# Patient Record
Sex: Male | Born: 1975
Health system: Southern US, Community
[De-identification: ages and names within clinical notes are randomized; demographics above are authoritative.]

## PROBLEM LIST (undated history)

## (undated) DIAGNOSIS — L409 Psoriasis, unspecified: Secondary | ICD-10-CM

---

## 2010-06-12 ENCOUNTER — Ambulatory Visit: Payer: Self-pay | Admitting: Family Medicine

## 2010-06-12 DIAGNOSIS — M2550 Pain in unspecified joint: Secondary | ICD-10-CM | POA: Insufficient documentation

## 2010-06-12 DIAGNOSIS — L408 Other psoriasis: Secondary | ICD-10-CM | POA: Insufficient documentation

## 2010-06-12 LAB — CONVERTED CEMR LAB: Rheumatoid fact SerPl-aCnc: <20 [IU]/mL (ref 0–20)

## 2010-06-15 LAB — CONVERTED CEMR LAB
ALT: 28 units/L (ref 0–53)
AST: 23 U/L (ref 0–37)
Albumin: 4.2 g/dL (ref 3.5–5.2)
Alkaline Phosphatase: 75 U/L (ref 39–117)
BUN: 14 mg/dL (ref 6–23)
Basophils Absolute: 0 10*3/uL (ref 0.0–0.1)
Basophils Relative: 0.5 % (ref 0.0–3.0)
Bilirubin, Direct: 0 mg/dL (ref 0.0–0.3)
CO2: 30 meq/L (ref 19–32)
Calcium: 9.8 mg/dL (ref 8.4–10.5)
Chloride: 103 meq/L (ref 96–112)
Cholesterol: 192 mg/dL (ref 0–200)
Creatinine, Ser: 0.9 mg/dL (ref 0.4–1.5)
Eosinophils Absolute: 0.1 10*3/uL (ref 0.0–0.7)
Eosinophils Relative: 1.2 % (ref 0.0–5.0)
GFR calc non Af Amer: 100.36 mL/min (ref >60)
Glucose, Bld: 96 mg/dL (ref 70–99)
HCT: 40.8 % (ref 39.0–52.0)
HDL: 46.3 mg/dL (ref >39.00)
Hemoglobin: 14.2 g/dL (ref 13.0–17.0)
LDL Cholesterol: 133 mg/dL — ABNORMAL HIGH (ref 0–99)
Lymphocytes Relative: 26 % (ref 12.0–46.0)
Lymphs Abs: 1.4 10*3/uL (ref 0.7–4.0)
MCHC: 34.7 g/dL (ref 30.0–36.0)
MCV: 90.7 fL (ref 78.0–100.0)
Monocytes Absolute: 0.4 10*3/uL (ref 0.1–1.0)
Monocytes Relative: 7 % (ref 3.0–12.0)
Neutro Abs: 3.4 10*3/uL (ref 1.4–7.7)
Neutrophils Relative %: 65.3 % (ref 43.0–77.0)
Platelets: 222 10*3/uL (ref 150.0–400.0)
Potassium: 5.1 meq/L (ref 3.5–5.1)
RBC: 4.5 M/uL (ref 4.22–5.81)
RDW: 13 % (ref 11.5–14.6)
Sed Rate: 21 mm/h (ref 0–22)
Sodium: 142 meq/L (ref 135–145)
TSH: 1.12 microintl units/mL (ref 0.35–5.50)
Total Bilirubin: 0.5 mg/dL (ref 0.3–1.2)
Total CHOL/HDL Ratio: 4
Total Protein: 7 g/dL (ref 6.0–8.3)
Triglycerides: 62 mg/dL (ref 0.0–149.0)
Uric Acid, Serum: 7.7 mg/dL (ref 4.0–7.8)
VLDL: 12.4 mg/dL (ref 0.0–40.0)
WBC: 5.2 10*3/uL (ref 4.5–10.5)

## 2010-09-08 NOTE — Assessment & Plan Note (Signed)
Summary: BRAND NEW PT/TO EST/PT REQ CPX/PT COMING IN FASTING/CJR   Vital Signs:  Patient profile:   35 year old male Height:      68.75 inches Weight:      183 pounds BMI:     27.32 Temp:     98.6 degrees F oral Pulse rate:   80 / minute Pulse rhythm:   regular Resp:     12 per minute BP sitting:   120 / 80  (left arm) Cuff size:   regular  Vitals Entered By: Sid Falcon LPN (June 12, 2010 8:59 AM)  Nutrition Counseling: Patient's BMI is greater than 25 and therefore counseled on weight management options.   History of Present Illness: new patient to establish care and for complete physical examination.  Past medical history reviewed. He has history of reported psoriasis and possible seborrheic dermatitis. Uses various topical steroids per dermatologist. History of several arthralgias mostly involving ankle, toes, and hands. Takes anti-inflammatories as needed.  No dx of psoriatic arthritis. Past labs unremarkable.  He has questioned gout but does not recall any prior uric acid and no arthrocentesis for dx.   Reported heart murmur with prior echo showing no evidence for mitral valve prolapse. No known drug allergies.  Family history significant for 2 siblings with reported hereditary spherocytosis. Father with bipolar illness. Hypertension in grandparents.  Patient is married. Massage therapist. Nonsmoker. No alcohol use.  Preventive Screening-Counseling & Management  Alcohol-Tobacco     Smoking Status: never  Caffeine-Diet-Exercise     Does Patient Exercise: yes  Allergies (verified): No Known Drug Allergies  Past History:  Family History: Last updated: 06/12/2010 Father, hypertension, bi-polar  Social History: Last updated: 06/12/2010 Occupation:  Massage therapist Married Never Smoked Alcohol use-no Regular exercise-yes  Risk Factors: Exercise: yes (06/12/2010)  Risk Factors: Smoking Status: never (06/12/2010)  Past Medical History: chicken  pox heart murmur Psoraisis Seborrheic dermatitis PMH-FH-SH reviewed for relevance  Family History: Father, hypertension, bi-polar  Social History: Occupation:  Massage therapist Married Never Smoked Alcohol use-no Regular exercise-yes Smoking Status:  never Occupation:  employed Does Patient Exercise:  yes  Review of Systems       The patient complains of weight gain.  The patient denies anorexia, fever, weight loss, vision loss, decreased hearing, hoarseness, chest pain, syncope, dyspnea on exertion, peripheral edema, prolonged cough, headaches, hemoptysis, abdominal pain, melena, hematochezia, severe indigestion/heartburn, hematuria, incontinence, genital sores, muscle weakness, suspicious skin lesions, transient blindness, difficulty walking, depression, unusual weight change, abnormal bleeding, enlarged lymph nodes, and testicular masses.    Physical Exam  General:  Well-developed,well-nourished,in no acute distress; alert,appropriate and cooperative throughout examination Head:  Normocephalic and atraumatic without obvious abnormalities. No apparent alopecia or balding. Eyes:  No corneal or conjunctival inflammation noted. EOMI. Perrla. Funduscopic exam benign, without hemorrhages, exudates or papilledema. Vision grossly normal. Ears:  External ear exam shows no significant lesions or deformities.  Otoscopic examination reveals clear canals, tympanic membranes are intact bilaterally without bulging, retraction, inflammation or discharge. Hearing is grossly normal bilaterally. Mouth:  Oral mucosa and oropharynx without lesions or exudates.  Teeth in good repair. Neck:  No deformities, masses, or tenderness noted. Lungs:  Normal respiratory effort, chest expands symmetrically. Lungs are clear to auscultation, no crackles or wheezes. Heart:  Normal rate and regular rhythm. S1 and S2 normal without gallop, murmur, click, rub or other extra sounds. Abdomen:  Bowel sounds  positive,abdomen soft and non-tender without masses, organomegaly or hernias noted. Genitalia:  Testes bilaterally descended  without nodularity, tenderness or masses. No scrotal masses or lesions. No penis lesions or urethral discharge. Msk:  No deformity or scoliosis noted of thoracic or lumbar spine.   Extremities:  R ankle edema medially with mild warmth but no erythema. Neurologic:  No cranial nerve deficits noted. Station and gait are normal. Plantar reflexes are down-going bilaterally. DTRs are symmetrical throughout. Sensory, motor and coordinative functions appear intact. Skin:  no suspicious lesions.  patient has areas of rash mostly behind both ears reddish well demarcated wtih silvery scale. Similar rash both knees. Cervical Nodes:  No lymphadenopathy noted Psych:  Cognition and judgment appear intact. Alert and cooperative with normal attention span and concentration. No apparent delusions, illusions, hallucinations   Impression & Recommendations:  Problem # 1:  Preventive Health Care (ICD-V70.0) discussed exercise, weight control  Problem # 2:  ARTHRALGIA (ICD-719.40) doubt acute inflammatroy arthritis.  No active signs of inflammation on exam at this time. Orders: T-Rheumatoid Factor [phone]) Venipuncture (96295) Specimen Handling (28413) TLB-Uric Acid, Blood (84550-URIC) TLB-Sedimentation Rate (ESR) (85652-ESR)  Problem # 3:  PSORIASIS (ICD-696.1)  Complete Medication List: 1)  Celebrex 200 Mg Caps (Celecoxib) .... One by mouth once daily as needed 2)  Diclofenac Sodium 75 Mg Tbec (Diclofenac sodium) .... As needed 3)  Methocarbamol 500 Mg Tabs (Methocarbamol) .... As needed 4)  Fluocinonide 0.05 % Crea (Fluocinonide) .... As needed 5)  Desonide 0.05 % Crea (Desonide) .... As needed  Other Orders: TLB-Lipid Panel (80061-LIPID) TLB-BMP (Basic Metabolic Panel-BMET) (80048-METABOL) TLB-CBC Platelet - w/Differential (85025-CBCD) TLB-Hepatic/Liver Function Pnl  (80076-HEPATIC) TLB-TSH (Thyroid Stimulating Hormone) (24401-UUV)  Patient Instructions: 1)  Please schedule a follow-up appointment in 1 year.  2)  It is important that you exercise reguarly at least 20 minutes 5 times a week. If you develop chest pain, have severe difficulty breathing, or feel very tired, stop exercising immediately and seek medical attention.  Prescriptions: CELEBREX 200 MG CAPS (CELECOXIB) one by mouth once daily as needed  #30 x 5   Entered and Authorized by:   Evelena Peat MD   Signed by:   Evelena Peat MD on 06/12/2010   Method used:   Electronically to        [facility]* (retail)       [address]       [address] Shipping/mailing       [place], [place]  [address]       Ph: [phone]       Fax: [phone]   RxID:   [id number]    Orders Added: 1)  New Patient 18-39 years [99385] 2)  T-Rheumatoid Factor [id number] 3)  Venipuncture [32202] 4)  Specimen Handling [99000] 5)  TLB-Lipid Panel [80061-LIPID] 6)  TLB-BMP (Basic Metabolic Panel-BMET) [80048-METABOL] 7)  TLB-CBC Platelet - w/Differential [85025-CBCD] 8)  TLB-Hepatic/Liver Function Pnl [80076-HEPATIC] 9)  TLB-TSH (Thyroid Stimulating Hormone) [84443-TSH] 10)  TLB-Uric Acid, Blood [84550-URIC] 11)  TLB-Sedimentation Rate (ESR) [85652-ESR]    Preventive Care Screening  Last Tetanus Booster:    Date:  08/09/2005    Results:  Historical

## 2011-07-09 ENCOUNTER — Telehealth: Payer: Self-pay | Admitting: Family Medicine

## 2011-07-09 MED ORDER — FLUOCINONIDE-E 0.05 % EX CREA: TOPICAL_CREAM | Freq: Two times a day (BID) | CUTANEOUS | Status: DC

## 2011-07-09 MED ORDER — FLUOCINONIDE 0.05 % EX SOLN: Freq: Two times a day (BID) | CUTANEOUS | Status: DC

## 2011-07-09 MED ORDER — DESONIDE 0.05 % EX CREA: TOPICAL_CREAM | CUTANEOUS | Status: DC

## 2011-07-09 NOTE — Telephone Encounter (Signed)
Pt requesting refill on FLUOCINONIDE 0.05 % CREA (FLUOCINONIDE) and Solution and DESONIDE 0.05 % CREA (DESONIDE)  [facility] patient pharmacy

## 2011-07-09 NOTE — Telephone Encounter (Signed)
None of these medicines are on pt med list, new pt 11/4. I left a message for pt to clarify reason for refill request and is he requesting 3 meds, or 2...  2 creams and a solution?  Has he used these medications in the past?

## 2011-07-09 NOTE — Telephone Encounter (Signed)
Pt left a VM, 2 creams and 1 solution are for psorisis

## 2011-10-28 ENCOUNTER — Ambulatory Visit (INDEPENDENT_AMBULATORY_CARE_PROVIDER_SITE_OTHER): Payer: 59 | Admitting: Family Medicine

## 2011-10-28 ENCOUNTER — Telehealth: Payer: Self-pay | Admitting: *Deleted

## 2011-10-28 ENCOUNTER — Encounter: Payer: Self-pay | Admitting: Family Medicine

## 2011-10-28 VITALS — BP 118/78 | Temp 98.7°F | Wt 200.0 lb

## 2011-10-28 DIAGNOSIS — J069 Acute upper respiratory infection, unspecified: Secondary | ICD-10-CM

## 2011-10-28 NOTE — Telephone Encounter (Signed)
Appt scheduled with Dr. Caryl Never this afternoon for ? Sinus infection.

## 2011-10-28 NOTE — Patient Instructions (Addendum)
Viral Infections A viral infection can be caused by different types of viruses.Most viral infections are not serious and resolve on their own. However, some infections may cause severe symptoms and may lead to further complications. SYMPTOMS Viruses can frequently cause:  Minor sore throat.   Aches and pains.   Headaches.   Runny nose.   Different types of rashes.   Watery eyes.   Tiredness.   Cough.   Loss of appetite.   Gastrointestinal infections, resulting in nausea, vomiting, and diarrhea.  These symptoms do not respond to antibiotics because the infection is not caused by bacteria. However, you might catch a bacterial infection following the viral infection. This is sometimes called a "superinfection." Symptoms of such a bacterial infection may include:  Worsening sore throat with pus and difficulty swallowing.   Swollen neck glands.   Chills and a high or persistent fever.   Severe headache.   Tenderness over the sinuses.   Persistent overall ill feeling (malaise), muscle aches, and tiredness (fatigue).   Persistent cough.   Yellow, green, or brown mucus production with coughing.  HOME CARE INSTRUCTIONS   Only take over-the-counter or prescription medicines for pain, discomfort, diarrhea, or fever as directed by your caregiver.   Drink enough water and fluids to keep your urine clear or pale yellow. Sports drinks can provide valuable electrolytes, sugars, and hydration.   Get plenty of rest and maintain proper nutrition. Soups and broths with crackers or rice are fine.  SEEK IMMEDIATE MEDICAL CARE IF:   You have severe headaches, shortness of breath, chest pain, neck pain, or an unusual rash.   You have uncontrolled vomiting, diarrhea, or you are unable to keep down fluids.   You or your child has an oral temperature above 102 F (38.9 C), not controlled by medicine.   Your baby is older than 3 months with a rectal temperature of 102 F (38.9 C) or  higher.   Your baby is 37 months old or younger with a rectal temperature of 100.4 F (38 C) or higher.  MAKE SURE YOU:   Understand these instructions.   Will watch your condition.   Will get help right away if you are not doing well or get worse.  Document Released: 05/05/2005 Document Revised: 07/15/2011 Document Reviewed: 11/30/2010 [facility] Patient Information 2012 [place], [place].  Consider over the counter sudafed and continue with Netti pot

## 2011-10-28 NOTE — Progress Notes (Signed)
  Subjective:    Patient ID: Bernard Manning, male    DOB: 06-Jul-1976, 36 y.o.   MRN: [id number]  HPI  Acute visit. Patient seen with sinus pressure past 3 days. Has had sore throat but no headache. Bilateral ear pressure. No cough. Took some type of over-the-counter medication without much improvement. Using Netti pot but possibly hypertonic solution as he was using 2 packs. Denies nausea or vomiting.   Review of Systems  Constitutional: Positive for fatigue. Negative for fever and chills.  HENT: Positive for congestion, sore throat and sinus pressure.   Respiratory: Negative for cough.   Neurological: Negative for headaches.       Objective:   Physical Exam  Constitutional: He appears well-developed and well-nourished.  HENT:  Right Ear: External ear normal.  Left Ear: External ear normal.  Mouth/Throat: Oropharynx is clear and moist.       Nasal mucosa erythematous otherwise normal  Cardiovascular: Normal rate and regular rhythm.   Pulmonary/Chest: Effort normal and breath sounds normal. No respiratory distress. He has no wheezes. He has no rales.  Skin: No rash noted.          Assessment & Plan:  Viral URI. Try over-the-counter Sudafed. Continue nasal saline irrigation but advised to avoid hypertonic solution

## 2012-06-18 ENCOUNTER — Emergency Department (INDEPENDENT_AMBULATORY_CARE_PROVIDER_SITE_OTHER): Payer: 59

## 2012-06-18 ENCOUNTER — Emergency Department
Admission: EM | Admit: 2012-06-18 | Discharge: 2012-06-18 | Disposition: A | Discharge status: Home / Self Care | Payer: 59 | Source: Home / Self Care | Attending: Emergency Medicine | Admitting: Emergency Medicine

## 2012-06-18 ENCOUNTER — Encounter: Payer: Self-pay | Admitting: Emergency Medicine

## 2012-06-18 DIAGNOSIS — M79642 Pain in left hand: Secondary | ICD-10-CM

## 2012-06-18 DIAGNOSIS — S6990XA Unspecified injury of unspecified wrist, hand and finger(s), initial encounter: Secondary | ICD-10-CM

## 2012-06-18 DIAGNOSIS — M79641 Pain in right hand: Secondary | ICD-10-CM

## 2012-06-18 DIAGNOSIS — M79609 Pain in unspecified limb: Secondary | ICD-10-CM

## 2012-06-18 DIAGNOSIS — X58XXXA Exposure to other specified factors, initial encounter: Secondary | ICD-10-CM

## 2012-06-18 HISTORY — DX: Psoriasis, unspecified: L40.9

## 2012-06-18 MED ORDER — DICLOFENAC SODIUM 1 % TD GEL: 2.0000 g | Freq: Four times a day (QID) | TRANSDERMAL | Status: DC

## 2012-06-18 MED ORDER — TRAMADOL HCL 50 MG PO TABS: 50.0000 mg | ORAL_TABLET | Freq: Four times a day (QID) | ORAL | Status: DC | PRN

## 2012-06-18 NOTE — ED Provider Notes (Signed)
History     CSN: [id number]  Arrival date & time 06/18/12  1401   First MD Initiated Contact with Patient 06/18/12 1422      Chief Complaint  Patient presents with  . Hand Injury    (Consider location/radiation/quality/duration/timing/severity/associated sxs/prior treatment) HPI 1) This patient reports right fifth finger and hand pain over the last 2 weeks.  He is very active during martial arts activities and feels that it was injured recently.  He does not recall any specific activity but thinks it and could've gotten pulled or twisted and is curious if he broke anything.  It seems to be getting a little but better, he has full range of motion.  He is right-handed.  His been using over-the-counter anti-inflammatories and Celebrex which help a little bit.  No previous injuries to this particular finger.  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  2) He also reports left thumb pain.  Last night he was doing martial arts and was getting flipped and felt & saw his left thumb appear to be dislocated or bend in a funny direction.  He has no previous injuries to his thumb and has not used any medications or modalities yet for this.  He is very active in martial arts and like to get back to activities as quickly as possible.   He is curious as to if it is broken or not.  No other injuries noted.  3) He also notes right elbow pain, worse with supination and some with flexion.  He does not want the office today and I told him that we can refer him to sports medicine for this since it has been going on for about 6 months.  Past Medical History  Diagnosis Date  . Psoriasis     History reviewed. No pertinent past surgical history.  Family History  Problem Relation Age of Onset  . Hypertension Father     History  Substance Use Topics  . Smoking status: Never Smoker   . Smokeless tobacco: Not on file  . Alcohol Use: No      Review of Systems  All  other systems reviewed and are negative.    Allergies  Review of patient's allergies indicates no known allergies.  Home Medications   Current Outpatient Rx  Name  Route  Sig  Dispense  Refill  . DESONIDE 0.05 % EX CREA      Apply to affected area 2 times daily   60 g   1   . DICLOFENAC SODIUM 1 % TD GEL   Topical   Apply 2 g topically 4 (four) times daily. Please dispense 3-pack if available   1 Tube   1   . FLUOCINONIDE 0.05 % EX SOLN   Topical   Apply topically 2 (two) times daily.   60 mL   1   . FLUOCINONIDE-E 0.05 % EX CREA   Topical   Apply topically 2 (two) times daily.   60 g   1   . TRAMADOL HCL 50 MG PO TABS   Oral   Take 1 tablet (50 mg total) by mouth every 6 (six) hours as needed for pain.   24 tablet   0     BP 121/74  Pulse 55  Temp 98.2 F (36.8 C) (Oral)  Resp 16  Ht 5\' 9"  (1.753 m)  Wt 198 lb (89.812 kg)  BMI 29.24 kg/m2  SpO2 99%  Physical Exam  Nursing note and vitals reviewed. Constitutional: He is oriented to person, place, and time. He appears well-developed and well-nourished.  HENT:  Head: Normocephalic and atraumatic.  Eyes: No scleral icterus.  Neck: Neck supple.  Cardiovascular: Regular rhythm and normal heart sounds.   Pulmonary/Chest: Effort normal and breath sounds normal. No respiratory distress.  Musculoskeletal:       1) examination is right hand demonstrates some tenderness around the fifth metacarpal phalangeal joint.  However he has full range of motion and there is no gross deformity of that knuckle.  Axial loading does not painful.  There is no ligamentous laxity.  He has full range of motion of every joint in that finger in the distal neurovascular status intact.  2) examination the left hand demonstrates full range of motion as well in the thumb.  The metacarpophalangeal joint is tender, swollen and slightly bruised.  There is no gross laxity of the ulnar collateral ligament, but stressing it does cause some  discomfort.  No deformity or obvious dislocation.  Distal neurovascular status is intact  3) distal biceps tendon feels intact.  Strength of supination on the right seems less than on the left.  No bruising or swelling noted.  No deformity.  His rotator cuff strength appears normal.  Distal neurovascular status is intact.  Neurological: He is alert and oriented to person, place, and time.  Skin: Skin is warm and dry.  Psychiatric: He has a normal mood and affect. His speech is normal.    ED Course  Procedures (including critical care time)  Labs Reviewed - No data to display Dg Hand Complete Left  06/18/2012  *RADIOLOGY REPORT*  Clinical Data: Hand post trauma  LEFT HAND - COMPLETE 3+ VIEW  Comparison: None.  Findings: Three views of the left hand submitted.  No acute fracture or subluxation.  No radiopaque foreign body.  IMPRESSION: No acute fracture or subluxation.   Original Report Authenticated By: Natasha Mead, M.D.    Dg Hand Complete Right  06/18/2012  *RADIOLOGY REPORT*  Clinical Data: Right hand pain.  RIGHT HAND - COMPLETE 3+ VIEW  Comparison:  None.  Findings:  There is no evidence of fracture or dislocation.  There is no evidence of arthropathy or other focal bone abnormality. Soft tissues are unremarkable.  IMPRESSION: Negative.   Original Report Authenticated By: Irish Lack, M.D.      1. Right hand pain   2. Left hand pain       MDM   X-rays were obtained of both hands to rule out fractures.  It was read by radiology as above.  4 the right and I feel is most likely a hand contusion versus a simple ligament sprain.  Since it is getting better I would like him to continue doing what he's doing.  I did give her prescription for Voltaren gel and some tramadol to use as needed for the next couple days.   For the left hand, I feel this is most likely a ulnar collateral ligament sprain.  No skin or lesions seen on x-ray so we can treat this conservatively.  I did give him a  thumb spica splint to wear for the next 2 weeks fairly regularly.   For his right elbow, like him to followup with Dr. Pearletha Forge and gave him a card for that.  Since it has been going for 6 months, an MRI would be appropriate to find out the true etiology.  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  Marlaine Hind, MD 06/18/12 [phone]

## 2012-06-18 NOTE — ED Notes (Addendum)
Patient reports injuring left thumb last evening; and right #5 finger one week ago during martial arts activities; ROM limited in both sites with noticeable edema surrounding base of left thumb.

## 2012-06-22 ENCOUNTER — Ambulatory Visit (INDEPENDENT_AMBULATORY_CARE_PROVIDER_SITE_OTHER): Payer: 59 | Admitting: Family Medicine

## 2012-06-22 VITALS — BP 120/70 | Ht 69.0 in | Wt 200.0 lb

## 2012-06-22 DIAGNOSIS — S63659A Sprain of metacarpophalangeal joint of unspecified finger, initial encounter: Secondary | ICD-10-CM

## 2012-06-22 DIAGNOSIS — M25521 Pain in right elbow: Secondary | ICD-10-CM

## 2012-06-22 DIAGNOSIS — S63649A Sprain of metacarpophalangeal joint of unspecified thumb, initial encounter: Secondary | ICD-10-CM

## 2012-06-22 DIAGNOSIS — M25529 Pain in unspecified elbow: Secondary | ICD-10-CM

## 2012-06-22 MED ORDER — PREDNISONE (PAK) 10 MG PO TABS: ORAL_TABLET | ORAL | Status: DC

## 2012-06-22 NOTE — Patient Instructions (Addendum)
MRI is scheduled for Saturday, June 24, 2012 at 3pm here at the [facility].  Register in the ER around 2:45pm. [phone]

## 2012-06-23 ENCOUNTER — Encounter: Payer: Self-pay | Admitting: Family Medicine

## 2012-06-23 DIAGNOSIS — M25521 Pain in right elbow: Secondary | ICD-10-CM | POA: Insufficient documentation

## 2012-06-23 DIAGNOSIS — S63649A Sprain of metacarpophalangeal joint of unspecified thumb, initial encounter: Secondary | ICD-10-CM | POA: Insufficient documentation

## 2012-06-23 NOTE — Progress Notes (Signed)
Subjective:    Patient ID: Bernard Manning, male    DOB: 1976-03-03, 36 y.o.   MRN: [id number]  PCP: Dr. Caryl Never  HPI 36 yo M here for right elbow pain, left thumb injury.  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 No acute pull or pop. Has been able to continue with work and martial arts. Supination has become difficult with decreased strength. Has pain in antecubital space. Has worsened last 2-3 weeks. Has tried physical therapy, nsaids, topical nsaids, ART, ultrasound, heat/ice, dry needling without much success.  Also reports 6 days ago had his thumbs on gi of another martial artist when that person did a move causing left thumb to be stuck in position and hyperextend. Looked and felt like thumb was dislocated but unsure which joint. + swelling and bruising. Radiographs at Urgent care negative for fracture. Placed in thumb spica brace - he is using a less restrictive one.  Past Medical History  Diagnosis Date  . Psoriasis     Current Outpatient Prescriptions on File Prior to Visit  Medication Sig Dispense Refill  . desonide (DESOWEN) 0.05 % cream Apply to affected area 2 times daily  60 g  1  . diclofenac sodium (VOLTAREN) 1 % GEL Apply 2 g topically 4 (four) times daily. Please dispense 3-pack if available  1 Tube  1  . fluocinonide (LIDEX) 0.05 % external solution Apply topically 2 (two) times daily.  60 mL  1  . fluocinonide-emollient (LIDEX-E) 0.05 % cream Apply topically 2 (two) times daily.  60 g  1  . traMADol (ULTRAM) 50 MG tablet Take 1 tablet (50 mg total) by mouth every 6 (six) hours as needed for pain.  24 tablet  0    History reviewed. No pertinent past surgical history.  No Known Allergies  History   Social History  . Marital Status: Married    Spouse Name: N/A    Number of Children: N/A  . Years of Education: N/A   Occupational History  . Not on file.   Social History Main Topics  .  Smoking status: Never Smoker   . Smokeless tobacco: Not on file  . Alcohol Use: No  . Drug Use: Not on file  . Sexually Active: Not on file   Other Topics Concern  . Not on file   Social History Narrative  . No narrative on file    Family History  Problem Relation Age of Onset  . Hypertension Father   . Hyperlipidemia Neg Hx   . Heart attack Neg Hx   . Sudden death Neg Hx   . Diabetes Neg Hx     BP 120/70  Ht 5\' 9"  (1.753 m)  Wt 200 lb (90.719 kg)  BMI 29.53 kg/m2  Review of Systems See HPI above.    Objective:   Physical Exam Gen: NAD  R elbow: Bruising lateral forearm.  No other deformity, swelling.  No erythema. No focal epicondyle, biceps tendon, olecranon, other TTP about elbow. FROM with pain on flexion and extension, supination. Strength 4/5 with supination, 5-/5 with flexion.  5/5 with extension. Collateral ligaments intact. NVI distally. Negative tinels at cubital tunnel. Negative hook of biceps tendon.  L thumb: No gross deformity, swelling, bruising. FROM IP, MCP joint with 5/5 strength against resistance. Mild laxity of UCL MCP joint but has end point. NVI distally.     Assessment & Plan:  1. Right elbow pain -  6 months of deep seated elbow pain without an obvious injury.  He is having problems with supination and flexion with decreased strength suggesting biceps tendon pathology but this feels intact.  Given extensive treatment to date advised we go ahead with MRI to further assess.  2. 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  Continue thumb spica bracing for 2-4 weeks until pain resolved.  Icing, nsaids, tramadol as needed.

## 2012-06-23 NOTE — Assessment & Plan Note (Signed)
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  Continue thumb spica bracing for 2-4 weeks until pain resolved.  Icing, nsaids, tramadol as needed.

## 2012-06-23 NOTE — Assessment & Plan Note (Signed)
6 months of deep seated elbow pain without an obvious injury.  He is having problems with supination and flexion with decreased strength suggesting biceps tendon pathology but this feels intact.  Given extensive treatment to date advised we go ahead with MRI to further assess.

## 2012-06-24 ENCOUNTER — Ambulatory Visit (HOSPITAL_BASED_OUTPATIENT_CLINIC_OR_DEPARTMENT_OTHER)
Admission: RE | Admit: 2012-06-24 | Discharge: 2012-06-24 | Disposition: A | Discharge status: Home / Self Care | Payer: 59 | Source: Ambulatory Visit | Attending: Family Medicine | Admitting: Family Medicine

## 2012-06-24 DIAGNOSIS — M7150 Other bursitis, not elsewhere classified, unspecified site: Secondary | ICD-10-CM | POA: Insufficient documentation

## 2012-06-24 DIAGNOSIS — M25521 Pain in right elbow: Secondary | ICD-10-CM

## 2012-06-30 ENCOUNTER — Ambulatory Visit (INDEPENDENT_AMBULATORY_CARE_PROVIDER_SITE_OTHER): Payer: 59 | Admitting: Family Medicine

## 2012-06-30 VITALS — BP 124/81 | Ht 69.0 in | Wt 200.0 lb

## 2012-06-30 DIAGNOSIS — M25521 Pain in right elbow: Secondary | ICD-10-CM

## 2012-06-30 DIAGNOSIS — M25529 Pain in unspecified elbow: Secondary | ICD-10-CM

## 2012-06-30 MED ORDER — PREDNISONE (PAK) 10 MG PO TABS: ORAL_TABLET | ORAL | Status: DC

## 2012-07-09 ENCOUNTER — Encounter: Payer: Self-pay | Admitting: Family Medicine

## 2012-07-09 NOTE — Progress Notes (Signed)
Subjective:    Patient ID: Bernard Manning, male    DOB: Dec 16, 1975, 36 y.o.   MRN: [id number]  PCP: Dr. Caryl Never  HPI  36 yo M here for f/u right elbow pain, left thumb injury.  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 No acute pull or pop. Has been able to continue with work and martial arts. Supination has become difficult with decreased strength. Has pain in antecubital space. Has worsened last 2-3 weeks. Has tried physical therapy, nsaids, topical nsaids, ART, ultrasound, heat/ice, dry needling without much success.  Also reports 6 days ago had his thumbs on gi of another martial artist when that person did a move causing left thumb to be stuck in position and hyperextend. Looked and felt like thumb was dislocated but unsure which joint. + swelling and bruising. Radiographs at Urgent care negative for fracture. Placed in thumb spica brace - he is using a less restrictive one.  11/22: Patient returned today for consideration of injection of bicipitoradial bursitis.  Past Medical History  Diagnosis Date  . Psoriasis     Current Outpatient Prescriptions on File Prior to Visit  Medication Sig Dispense Refill  . desonide (DESOWEN) 0.05 % cream Apply to affected area 2 times daily  60 g  1  . diclofenac sodium (VOLTAREN) 1 % GEL Apply 2 g topically 4 (four) times daily. Please dispense 3-pack if available  1 Tube  1  . fluocinonide (LIDEX) 0.05 % external solution Apply topically 2 (two) times daily.  60 mL  1  . fluocinonide-emollient (LIDEX-E) 0.05 % cream Apply topically 2 (two) times daily.  60 g  1  . traMADol (ULTRAM) 50 MG tablet Take 1 tablet (50 mg total) by mouth every 6 (six) hours as needed for pain.  24 tablet  0    History reviewed. No pertinent past surgical history.  No Known Allergies  History   Social History  . Marital Status: Married    Spouse Name: N/A    Number of Children:  N/A  . Years of Education: N/A   Occupational History  . Not on file.   Social History Main Topics  . Smoking status: Never Smoker   . Smokeless tobacco: Not on file  . Alcohol Use: No  . Drug Use: Not on file  . Sexually Active: Not on file   Other Topics Concern  . Not on file   Social History Narrative  . No narrative on file    Family History  Problem Relation Age of Onset  . Hypertension Father   . Hyperlipidemia Neg Hx   . Heart attack Neg Hx   . Sudden death Neg Hx   . Diabetes Neg Hx     BP 124/81  Ht 5\' 9"  (1.753 m)  Wt 200 lb (90.719 kg)  BMI 29.53 kg/m2  Review of Systems  See HPI above.    Objective:   Physical Exam  Gen: NAD Exam not repeated today  R elbow: Bruising lateral forearm.  No other deformity, swelling.  No erythema. No focal epicondyle, biceps tendon, olecranon, other TTP about elbow. FROM with pain on flexion and extension, supination. Strength 4/5 with supination, 5-/5 with flexion.  5/5 with extension. Collateral ligaments intact. NVI distally. Negative tinels at cubital tunnel. Negative hook of biceps tendon.  L thumb: No gross deformity, swelling, bruising. FROM IP, MCP joint with 5/5 strength against resistance. Mild laxity of UCL  MCP joint but has end point. NVI distally.     Assessment & Plan:  1. Right elbow pain - Reviewed MRI with patient -- symptoms and exam consistent with bicipitoradial bursitis.  Is s/p 6 day course of prednisone with some improvement.  By ultrasound today there is no longer edema present in bicipitoradial bursa by trans or long views that would benefit from aspiration and/or injection - advised against doing this into the presumed area of the now flat bursa.  Will burst 2 week course of prednisone instead.  F/u in 1 month or as needed.    2. Left gamekeepers thumb - Deferred today - Grade 1-2 - has end point which is very reassuring.  Continue thumb spica bracing for 2-4 weeks until pain  resolved.  Icing, nsaids, tramadol as needed.

## 2012-07-18 MED ORDER — CELECOXIB 200 MG PO CAPS: 200.0000 mg | ORAL_CAPSULE | Freq: Two times a day (BID) | ORAL | Status: DC

## 2012-07-18 NOTE — Addendum Note (Signed)
Addended by: Lenda Kelp on: 07/18/2012 08:47 AM   Modules accepted: Orders

## 2012-11-16 MED ORDER — CELECOXIB 200 MG PO CAPS: 200.0000 mg | ORAL_CAPSULE | Freq: Two times a day (BID) | ORAL | Status: DC

## 2012-11-16 NOTE — Addendum Note (Signed)
Addended by: Norton Blizzard R on: 11/16/2012 11:01 AM   Modules accepted: Orders

## 2013-02-25 ENCOUNTER — Emergency Department
Admission: EM | Admit: 2013-02-25 | Discharge: 2013-02-25 | Disposition: A | Discharge status: Home / Self Care | Payer: 59 | Source: Home / Self Care | Attending: Family Medicine | Admitting: Family Medicine

## 2013-02-25 DIAGNOSIS — L255 Unspecified contact dermatitis due to plants, except food: Secondary | ICD-10-CM

## 2013-02-25 DIAGNOSIS — L237 Allergic contact dermatitis due to plants, except food: Secondary | ICD-10-CM

## 2013-02-25 MED ORDER — HYDROXYZINE HCL 25 MG PO TABS: 25.0000 mg | ORAL_TABLET | Freq: Three times a day (TID) | ORAL | Status: DC | PRN

## 2013-02-25 MED ORDER — PREDNISONE 50 MG PO TABS: ORAL_TABLET | ORAL | Status: DC

## 2013-02-25 MED ORDER — METHYLPREDNISOLONE SODIUM SUCC 125 MG IJ SOLR
125.0000 mg | Freq: Once | INTRAMUSCULAR | Status: AC
  Administered 2013-02-25: 125 mg via INTRAMUSCULAR

## 2013-02-25 NOTE — Discharge Instructions (Signed)
Poison Ivy °Poison ivy is a rash caused by touching the leaves of the poison ivy plant. The rash often shows up 48 hours later. You might just have bumps, redness, and itching. Sometimes, blisters appear and break open. Your eyes may get puffy (swollen). Poison ivy often heals in 2 to 3 weeks without treatment. °HOME CARE °· If you touch poison ivy: °· Wash your skin with soap and water right away. Wash under your fingernails. Do not rub the skin very hard. °· Wash any clothes you were wearing. °· Avoid poison ivy in the future. Poison ivy has 3 leaves on a stem. °· Use medicine to help with itching as told by your doctor. Do not drive when you take this medicine. °· Keep open sores dry, clean, and covered with a bandage and medicated cream, if needed. °· Ask your doctor about medicine for children. °GET HELP RIGHT AWAY IF: °· You have open sores. °· Redness spreads beyond the area of the rash. °· There is yellowish white fluid (pus) coming from the rash. °· Pain gets worse. °· You have a temperature by mouth above 102° F (38.9° C), not controlled by medicine. °MAKE SURE YOU: °· Understand these instructions. °· Will watch your condition. °· Will get help right away if you are not doing well or get worse. °Document Released: 08/28/2010 Document Revised: 10/18/2011 Document Reviewed: 08/28/2010 °Exit[employer]® Patient Information ©2014 [employer]. ° °

## 2013-02-25 NOTE — ED Notes (Signed)
Brownie complains of rash for 4 days.

## 2013-02-25 NOTE — ED Provider Notes (Signed)
History    CSN: [id number] Arrival date & time 02/25/13  1719  First MD Initiated Contact with Patient 02/25/13 1725     Chief Complaint  Patient presents with  . Rash    x 4 days    HPI  HPI  This patient complains of a RASH  Location: upper extremities   Onset: 2-3 days   Course: known poison ivy exposure 4-5 days ago. New onset rash over last 2-3 days   Self-treated with: calamine lotion   Improvement with treatment: minimal   History  Itching: yes  Tenderness: no  New medications/antibiotics: no  Pet exposure: no  Recent travel or tropical exposure: no  New soaps, shampoos, detergent, clothing: no  Tick/insect exposure: no  Chemical Exposure: no  Red Flags  Feeling ill: no  Fever: no  Facial/tongue swelling/difficulty breathing: no  Diabetic or immunocompromised: no    Past Medical History  Diagnosis Date  . Psoriasis    History reviewed. No pertinent past surgical history. Family History  Problem Relation Age of Onset  . Hypertension Father   . Hyperlipidemia Neg Hx   . Heart attack Neg Hx   . Sudden death Neg Hx   . Diabetes Neg Hx    History  Substance Use Topics  . Smoking status: Never Smoker   . Smokeless tobacco: Not on file  . Alcohol Use: No    Review of Systems  All other systems reviewed and are negative.    Allergies  Review of patient's allergies indicates no known allergies.  Home Medications   Current Outpatient Rx  Name  Route  Sig  Dispense  Refill  . celecoxib (CELEBREX) 200 MG capsule   Oral   Take 1 capsule (200 mg total) by mouth 2 (two) times daily.   60 capsule   1   . EXPIRED: desonide (DESOWEN) 0.05 % cream      Apply to affected area 2 times daily   60 g   1   . diclofenac sodium (VOLTAREN) 1 % GEL   Topical   Apply 2 g topically 4 (four) times daily. Please dispense 3-pack if available   1 Tube   1   . EXPIRED: fluocinonide (LIDEX) 0.05 % external solution   Topical   Apply topically 2 (two)  times daily.   60 mL   1   . EXPIRED: fluocinonide-emollient (LIDEX-E) 0.05 % cream   Topical   Apply topically 2 (two) times daily.   60 g   1   . hydrOXYzine (ATARAX/VISTARIL) 25 MG tablet   Oral   Take 1 tablet (25 mg total) by mouth 3 (three) times daily as needed for itching.   30 tablet   0   . predniSONE (DELTASONE) 50 MG tablet      1 tab daily x 5 days   5 tablet   0   . predniSONE (STERAPRED UNI-PAK) 10 MG tablet      6 tabs po day 1-2, 5 tabs po day 3-4, 4 tabs po day 5-6, 3 tabs po day 7-8, 2 tabs po day 9-10, 1 tab po day 11-12   42 tablet   0   . traMADol (ULTRAM) 50 MG tablet   Oral   Take 1 tablet (50 mg total) by mouth every 6 (six) hours as needed for pain.   24 tablet   0    BP 120/73  Pulse 63  Temp(Src) 98.2 F (36.8 C) (Oral)  Ht 5'  9" (1.753 m)  Wt 186 lb (84.369 kg)  BMI 27.45 kg/m2  SpO2 98% Physical Exam  Constitutional: He appears well-developed and well-nourished.  HENT:  Head: Normocephalic and atraumatic.  Eyes: Conjunctivae are normal. Pupils are equal, round, and reactive to light.  Neck: Normal range of motion. Neck supple.  Cardiovascular: Normal rate, regular rhythm and normal heart sounds.   Pulmonary/Chest: Effort normal.  Abdominal: Soft.  Musculoskeletal: Normal range of motion.  Neurological: He is alert.  Skin: Rash noted.    ED Course  Procedures (including critical care time) Labs Reviewed - No data to display No results found. 1. Poison ivy dermatitis     MDM  Solumedrol 125 mg IM x1 Will place on course of prednisone and atarax for treatment  Discussed general and derm red flags.  Follow up as needed.     The patient and/or caregiver has been counseled thoroughly with regard to treatment plan and/or medications prescribed including dosage, schedule, interactions, rationale for use, and possible side effects and they verbalize understanding. Diagnoses and expected course of recovery discussed and will  return if not improved as expected or if the condition worsens. Patient and/or caregiver verbalized understanding.       Doree Albee, MD 02/25/13 1750

## 2013-03-07 ENCOUNTER — Telehealth: Payer: Self-pay | Admitting: *Deleted

## 2013-04-06 ENCOUNTER — Other Ambulatory Visit: Payer: Self-pay | Admitting: Family Medicine

## 2013-07-02 ENCOUNTER — Telehealth: Payer: Self-pay | Admitting: Family Medicine

## 2013-07-02 ENCOUNTER — Encounter: Payer: Self-pay | Admitting: Family Medicine

## 2013-07-02 ENCOUNTER — Ambulatory Visit (INDEPENDENT_AMBULATORY_CARE_PROVIDER_SITE_OTHER): Payer: 59 | Admitting: Family Medicine

## 2013-07-02 VITALS — BP 122/66 | HR 68 | Temp 98.6°F | Wt 193.0 lb

## 2013-07-02 DIAGNOSIS — M1310 Monoarthritis, not elsewhere classified, unspecified site: Secondary | ICD-10-CM

## 2013-07-02 DIAGNOSIS — Z0000 Encounter for general adult medical examination without abnormal findings: Secondary | ICD-10-CM

## 2013-07-02 MED ORDER — COLCHICINE 0.6 MG PO TABS: 0.6000 mg | ORAL_TABLET | Freq: Every day | ORAL | Status: DC

## 2013-07-02 MED ORDER — PREDNISONE 10 MG PO TABS: ORAL_TABLET | ORAL | Status: DC

## 2013-07-02 NOTE — Progress Notes (Signed)
Pre visit review using our clinic review tool, if applicable. No additional management support is needed unless otherwise documented below in the visit note. 

## 2013-07-02 NOTE — Telephone Encounter (Signed)
Patient Information:  Caller Name: [place]  Phone: [phone]  Patient: Bernard Manning, Bernard Manning  Gender: Male  DOB: 18-Mar-1976  Age: 36 Years  PCP: Evelena Peat (Family Practice)  Office Follow Up:  Does the office need to follow up with this patient?: No  Instructions For The Office: N/A  RN Note:  Patient states he has had persistent pain in his left little toe for "months." Patient describes pain/swelling/redness at the base of the toe. Patient states toe is red, tender and swollen. States redness is only noted at the base of the toe. States redness is intermittent but swelling is persistent. Patient states sx have increased over the past 3 months. Patient states pain causes difficulty walking. States, "it hurts to put pressure on it." Patient states he has been taking Aleve TID and Tylenol BID with some relief. Denies injury. Patient states he was seen by Podiatrist at [facility] approx. 3 weeks ago and was advised that sx could be from Gout or Psoriatic Arthritis. Denies red streaks or drainage. Patient states he increased his water intake 07/01/13 and sx seemed to have improved slightly 07/02/13. Care advice given per guidelines. Call back parameters reviewed. Patient verbalizes understanding.  Symptoms  Reason For Call & Symptoms: Left littleToe pain  Reviewed Health History In EMR: Yes  Reviewed Medications In EMR: Yes  Reviewed Allergies In EMR: Yes  Reviewed Surgeries / Procedures: Yes  Date of Onset of Symptoms: Unknown  Treatments Tried: Aleve. Tylenol  Treatments Tried Worked: No  Guideline(s) Used:  Toe Pain  Disposition Per Guideline:   See Within 3 Days in Office  Reason For Disposition Reached:   Moderate pain (e.g., interferes with normal activities, limping) and present > 3 days  Advice Given:  Reassurance:  The symptoms you describe do not sound serious.  Call Back If:  Swelling, redness, or fever occur  You become worse.  Patient Will Follow Care  Advice:  YES  Appointment Scheduled:  07/02/2013 16:15:00 Appointment Scheduled Provider:  Evelena Peat [facility])

## 2013-07-02 NOTE — Progress Notes (Signed)
  Subjective:    Patient ID: Bernard Manning, male    DOB: Sep 14, 1975, 37 y.o.   MRN: [id number]  HPI Patient seen with left fifth toe pain. Pain off and on for several months. No history of injury. He's had intermittent episodes involving redness warmth and pain possibly with some mild swelling. He recently went to the podiatrist and had x-rays which were unremarkable. He gets temporary relief with Aleve. Podiatrist thought this may have represented gout. He does have history of psoriasis but no other joint involvement. He's never had involvement of the first MTP joint. He's had previous uric acid levels which were borderline high.  Generally stays well hydrated. He does sometimes consume things like red meat which could be triggering possible gout. He does not consume any alcohol. No family history of gout. He denies any arthralgias involving the hands, wrists, shoulders, knees, ankles, or hips  Past Medical History  Diagnosis Date  . Psoriasis    No past surgical history on file.  reports that he has never smoked. He does not have any smokeless tobacco history on file. He reports that he does not drink alcohol. His drug history is not on file. family history includes Hypertension in his father. There is no history of Hyperlipidemia, Heart attack, Sudden death, or Diabetes. No Known Allergies    Review of Systems  Constitutional: Negative for fever and chills.  Neurological: Negative for weakness and numbness.  Hematological: Negative for adenopathy.       Objective:   Physical Exam  Constitutional: He appears well-developed and well-nourished.  Cardiovascular: Normal rate and regular rhythm.   Pulmonary/Chest: Effort normal and breath sounds normal. No respiratory distress. He has no wheezes. He has no rales.  Musculoskeletal:  Left foot and left fifth toe were examined. Minimal tenderness at the metatarsophalangeal joint of the fifth joint only. Minimal if any erythema. Minimal  edema. No significant warmth.          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 Question gout. Less likely psoriatic arthritis-esp with one joint involved only. Placed on brief prednisone taper. Consider Colcrys 0.6 mg once daily until followup. He has upcoming physical and will get further labs to evaluate including uric acid level

## 2013-07-02 NOTE — Patient Instructions (Signed)

## 2013-07-18 ENCOUNTER — Other Ambulatory Visit (INDEPENDENT_AMBULATORY_CARE_PROVIDER_SITE_OTHER): Payer: 59

## 2013-07-18 DIAGNOSIS — Z0000 Encounter for general adult medical examination without abnormal findings: Secondary | ICD-10-CM

## 2013-07-18 DIAGNOSIS — M1310 Monoarthritis, not elsewhere classified, unspecified site: Secondary | ICD-10-CM

## 2013-07-18 LAB — BASIC METABOLIC PANEL WITH GFR
Calcium: 9.8 mg/dL (ref 8.4–10.5)
Glucose, Bld: 98 mg/dL (ref 70–99)
Potassium: 4.8 meq/L (ref 3.5–5.1)
Sodium: 144 meq/L (ref 135–145)

## 2013-07-18 LAB — CBC WITH DIFFERENTIAL/PLATELET
Basophils Absolute: 0 10*3/uL (ref 0.0–0.1)
Basophils Relative: 0.8 % (ref 0.0–3.0)
Eosinophils Absolute: 0.1 10*3/uL (ref 0.0–0.7)
Eosinophils Relative: 2.1 % (ref 0.0–5.0)
HCT: 38.9 % — ABNORMAL LOW (ref 39.0–52.0)
Hemoglobin: 13.3 g/dL (ref 13.0–17.0)
Lymphocytes Relative: 37.2 % (ref 12.0–46.0)
Lymphs Abs: 1.9 10*3/uL (ref 0.7–4.0)
MCHC: 34.1 g/dL (ref 30.0–36.0)
MCV: 88.5 fl (ref 78.0–100.0)
Monocytes Absolute: 0.4 10*3/uL (ref 0.1–1.0)
Monocytes Relative: 8.1 % (ref 3.0–12.0)
Neutro Abs: 2.7 10*3/uL (ref 1.4–7.7)
Neutrophils Relative %: 51.8 % (ref 43.0–77.0)
Platelets: 209 10*3/uL (ref 150.0–400.0)
RBC: 4.39 Mil/uL (ref 4.22–5.81)
RDW: 13.1 % (ref 11.5–14.6)
WBC: 5.2 10*3/uL (ref 4.5–10.5)

## 2013-07-18 LAB — BASIC METABOLIC PANEL
BUN: 18 mg/dL (ref 6–23)
CO2: 29 mEq/L (ref 19–32)
Chloride: 106 mEq/L (ref 96–112)
Creatinine, Ser: 1 mg/dL (ref 0.4–1.5)
GFR: 93.85 mL/min (ref >60.00)

## 2013-07-18 LAB — HEPATIC FUNCTION PANEL
ALT: 31 U/L (ref 0–53)
AST: 27 U/L (ref 0–37)
Albumin: 4 g/dL (ref 3.5–5.2)
Alkaline Phosphatase: 68 U/L (ref 39–117)
Bilirubin, Direct: 0 mg/dL (ref 0.0–0.3)
Total Bilirubin: 0.5 mg/dL (ref 0.3–1.2)
Total Protein: 7.1 g/dL (ref 6.0–8.3)

## 2013-07-18 LAB — LIPID PANEL
Cholesterol: 183 mg/dL (ref 0–200)
HDL: 43.5 mg/dL (ref >39.00)
LDL Cholesterol: 124 mg/dL — ABNORMAL HIGH (ref 0–99)
Total CHOL/HDL Ratio: 4
Triglycerides: 79 mg/dL (ref 0.0–149.0)
VLDL: 15.8 mg/dL (ref 0.0–40.0)

## 2013-07-18 LAB — SEDIMENTATION RATE: Sed Rate: 22 mm/hr (ref 0–22)

## 2013-07-18 LAB — TSH: TSH: 1.58 u[IU]/mL (ref 0.35–5.50)

## 2013-07-18 LAB — URIC ACID: Uric Acid, Serum: 7.9 mg/dL — ABNORMAL HIGH (ref 4.0–7.8)

## 2013-07-25 ENCOUNTER — Ambulatory Visit (INDEPENDENT_AMBULATORY_CARE_PROVIDER_SITE_OTHER): Payer: 59 | Admitting: Family Medicine

## 2013-07-25 ENCOUNTER — Encounter: Payer: Self-pay | Admitting: Family Medicine

## 2013-07-25 VITALS — BP 120/68 | HR 63 | Temp 98.5°F | Ht 69.0 in | Wt 196.0 lb

## 2013-07-25 DIAGNOSIS — Z0000 Encounter for general adult medical examination without abnormal findings: Secondary | ICD-10-CM

## 2013-07-25 DIAGNOSIS — M109 Gout, unspecified: Secondary | ICD-10-CM

## 2013-07-25 MED ORDER — COLCHICINE 0.6 MG PO TABS: 0.6000 mg | ORAL_TABLET | Freq: Every day | ORAL | Status: DC

## 2013-07-25 MED ORDER — ALLOPURINOL 100 MG PO TABS: ORAL_TABLET | ORAL | Status: DC

## 2013-07-25 MED ORDER — PREDNISONE 10 MG PO TABS: ORAL_TABLET | ORAL | Status: DC

## 2013-07-25 NOTE — Progress Notes (Signed)
   Subjective:    Patient ID: Bernard Manning, male    DOB: 09-18-1975, 37 y.o.   MRN: [id number]  HPI Patient seen for complete physical Recent issues with monoarticular arthritis involving to with suspected gout. Improved clinically with prednisone and colchicine. He had several months of issues with his toe. He wants to look at possible prophylactic treatment. Recent uric acid on recent blood work 7.9.  He takes no chronic medications. He has history of psoriasis. Tetanus up-to-date. Flu vaccine declined. Patient nonsmoker.  Past Medical History  Diagnosis Date  . Psoriasis    No past surgical history on file.  reports that he has never smoked. He does not have any smokeless tobacco history on file. He reports that he does not drink alcohol. His drug history is not on file. family history includes Hypertension in his father. There is no history of Hyperlipidemia, Heart attack, Sudden death, or Diabetes. No Known Allergies    Review of Systems  Constitutional: Negative for fever, activity change, appetite change and fatigue.  HENT: Negative for congestion, ear pain and trouble swallowing.   Eyes: Negative for pain and visual disturbance.  Respiratory: Negative for cough, shortness of breath and wheezing.   Cardiovascular: Negative for chest pain and palpitations.  Gastrointestinal: Negative for nausea, vomiting, abdominal pain, diarrhea, constipation, blood in stool, abdominal distention and rectal pain.  Genitourinary: Negative for dysuria, hematuria and testicular pain.  Musculoskeletal: Negative for arthralgias and joint swelling.  Skin: Negative for rash.  Neurological: Negative for dizziness, syncope and headaches.  Hematological: Negative for adenopathy.  Psychiatric/Behavioral: Negative for confusion and dysphoric mood.       Objective:   Physical Exam  Constitutional: He is oriented to person, place, and time. He appears well-developed and well-nourished. No distress.   HENT:  Head: Normocephalic and atraumatic.  Right Ear: External ear normal.  Left Ear: External ear normal.  Mouth/Throat: Oropharynx is clear and moist.  Eyes: Conjunctivae and EOM are normal. Pupils are equal, round, and reactive to light.  Neck: Normal range of motion. Neck supple. No thyromegaly present.  Cardiovascular: Normal rate, regular rhythm and normal heart sounds.   No murmur heard. Pulmonary/Chest: No respiratory distress. He has no wheezes. He has no rales.  Abdominal: Soft. Bowel sounds are normal. He exhibits no distension and no mass. There is no tenderness. There is no rebound and no guarding.  Musculoskeletal: He exhibits no edema.  Lymphadenopathy:    He has no cervical adenopathy.  Neurological: He is alert and oriented to person, place, and time. He displays normal reflexes. No cranial nerve deficit.  Skin: No rash noted.  Psychiatric: He has a normal mood and affect.          Assessment & Plan:  Complete physical. Flu vaccine offered and declined. Labs reviewed and all favorable with exception of mildly elevated uric acid  Gout.  Improved with recent colchicine and prednisone. Dietary factors discussed. Continue low-dose colchicine 0.6 mg daily for prophylaxis (until allopurinol titrated) and start allopurinol 100 mg daily for 2 weeks then titrate to 200 mg daily for 2 weeks then titrate to 300 mg daily. Reassess uric acid in 2-3 months

## 2013-07-25 NOTE — Patient Instructions (Signed)
Start Allopurinol one daily for 2 weeks,  Then two daily for 2 weeks, then three daily.

## 2013-11-01 ENCOUNTER — Telehealth: Payer: Self-pay | Admitting: Family Medicine

## 2013-11-01 MED ORDER — PREDNISONE 20 MG PO TABS: 20.0000 mg | ORAL_TABLET | Freq: Two times a day (BID) | ORAL | Status: DC

## 2013-11-01 NOTE — Telephone Encounter (Signed)
Pt aware and RX is sent to pharmacy 

## 2013-11-01 NOTE — Telephone Encounter (Signed)
Last visit 07/25/13

## 2013-11-01 NOTE — Telephone Encounter (Signed)
Pt is having a moderate attack of gout and the allopurinol and colchicine are not getting a handle on it. This has been 4 days of pain. Pt would like to know if dr would call in a light dose of prednisone to help w/ this. Pharm: [facility] / [address]  [phone]  (this pharm this time only)

## 2013-11-01 NOTE — Telephone Encounter (Signed)
Prednisone 20 mg - take two daily for 5 days.  Allopurinol is only used as prevention med and will not help acutely.

## 2014-01-21 IMAGING — CR DG HAND COMPLETE 3+V*L*
3 series · 3 of 3 positions shown · non-contrast
Comparison: None.

CLINICAL DATA: Hand post trauma

LEFT HAND - COMPLETE 3+ VIEW

[view not recorded (1 of 3)]
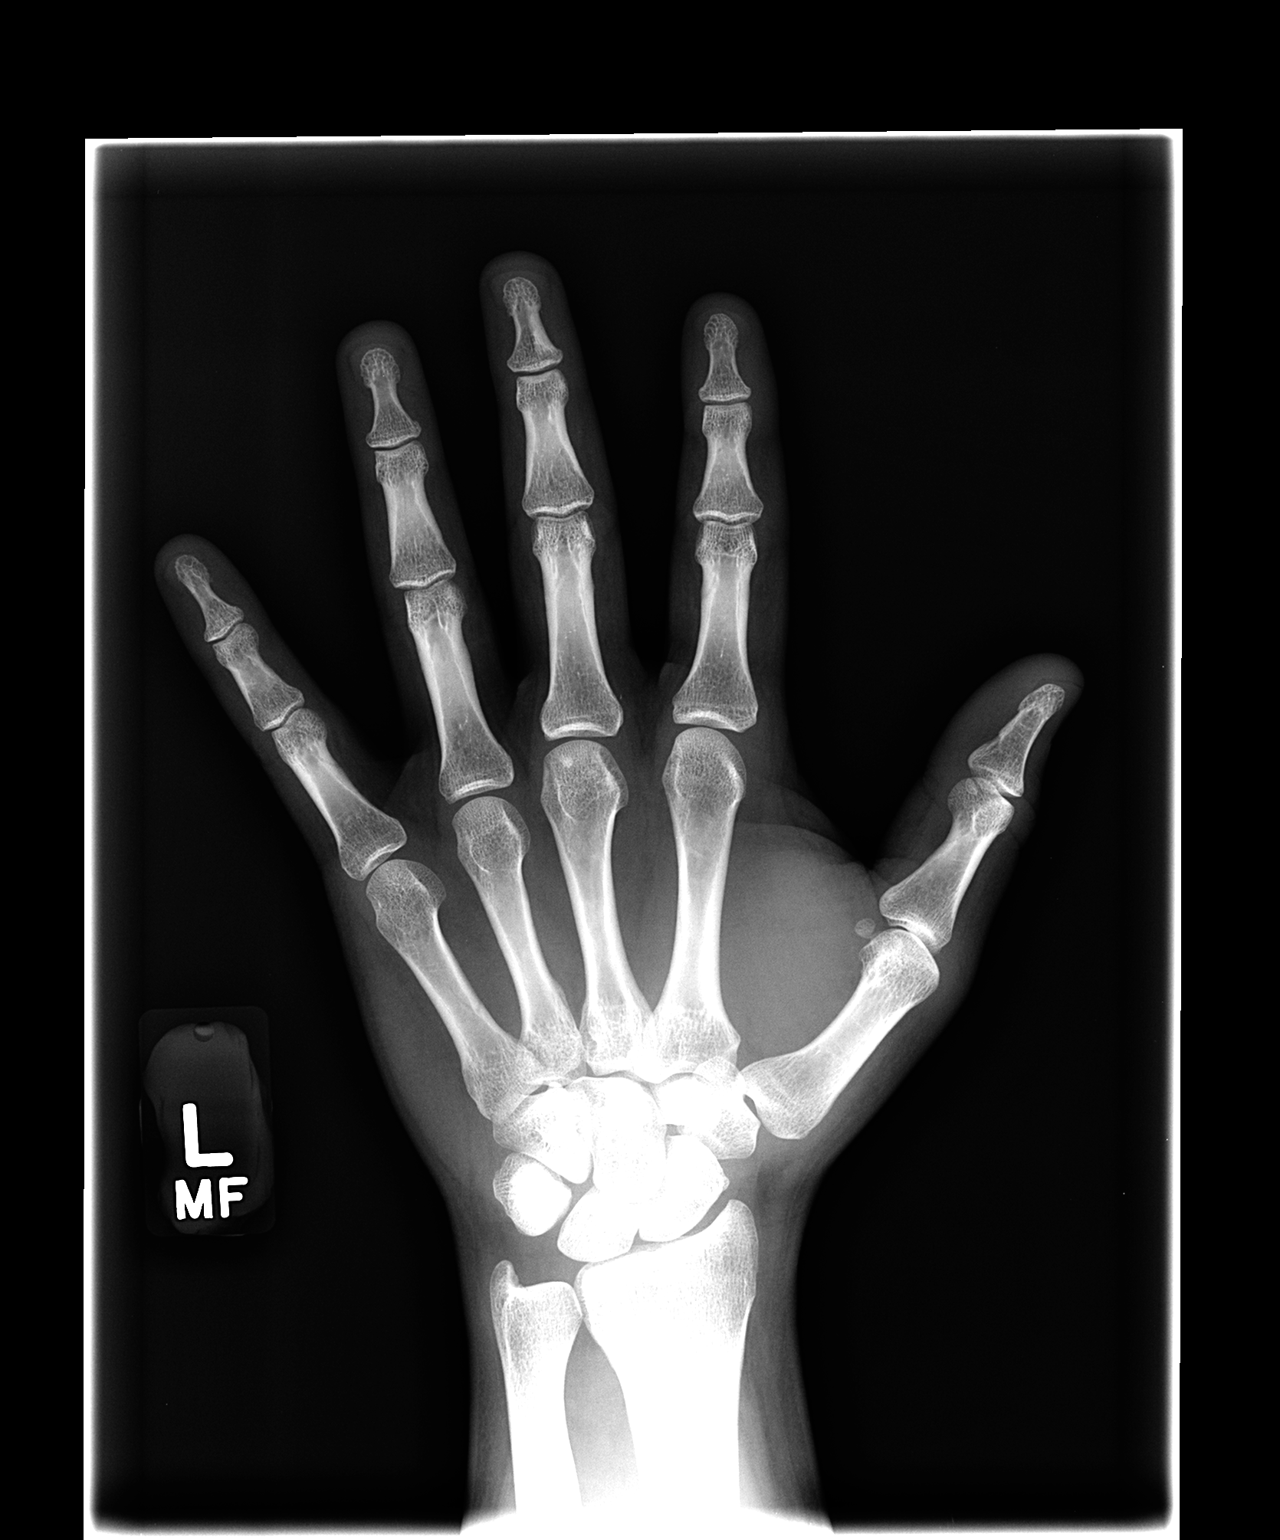

[view not recorded (2 of 3)]
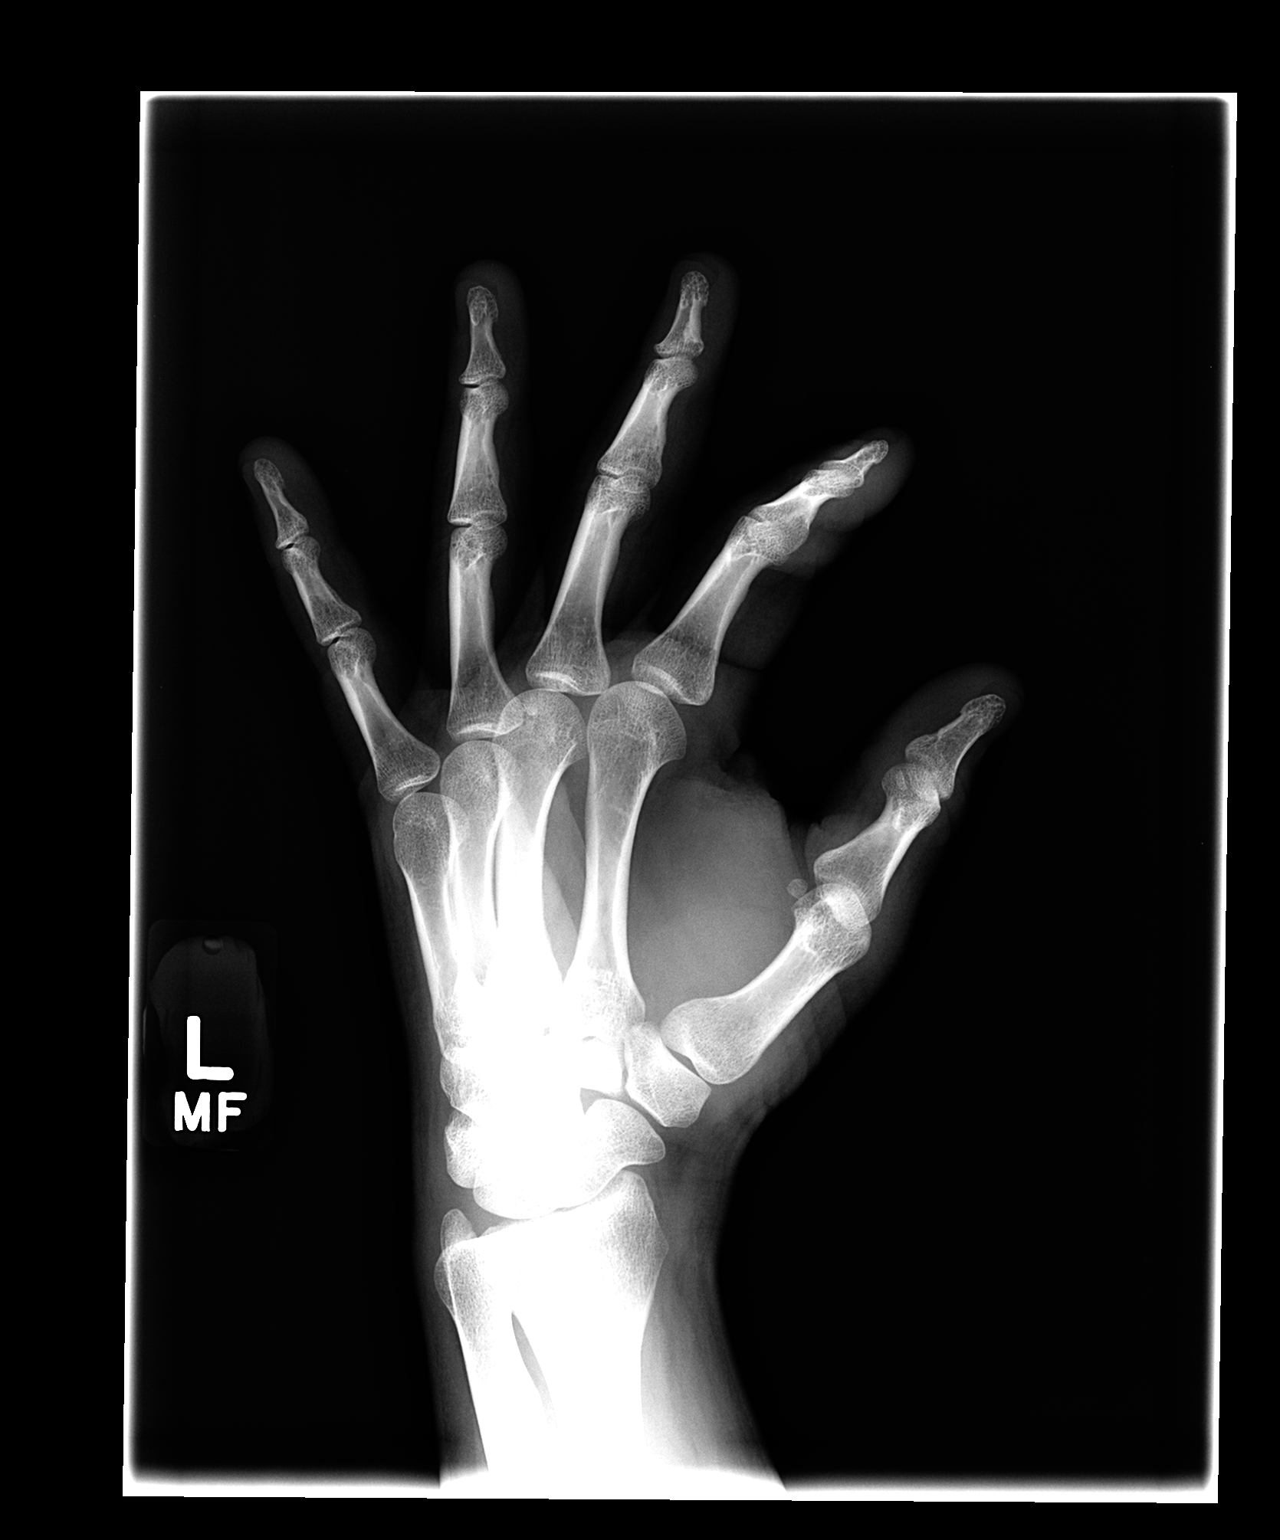

[view not recorded (3 of 3)]
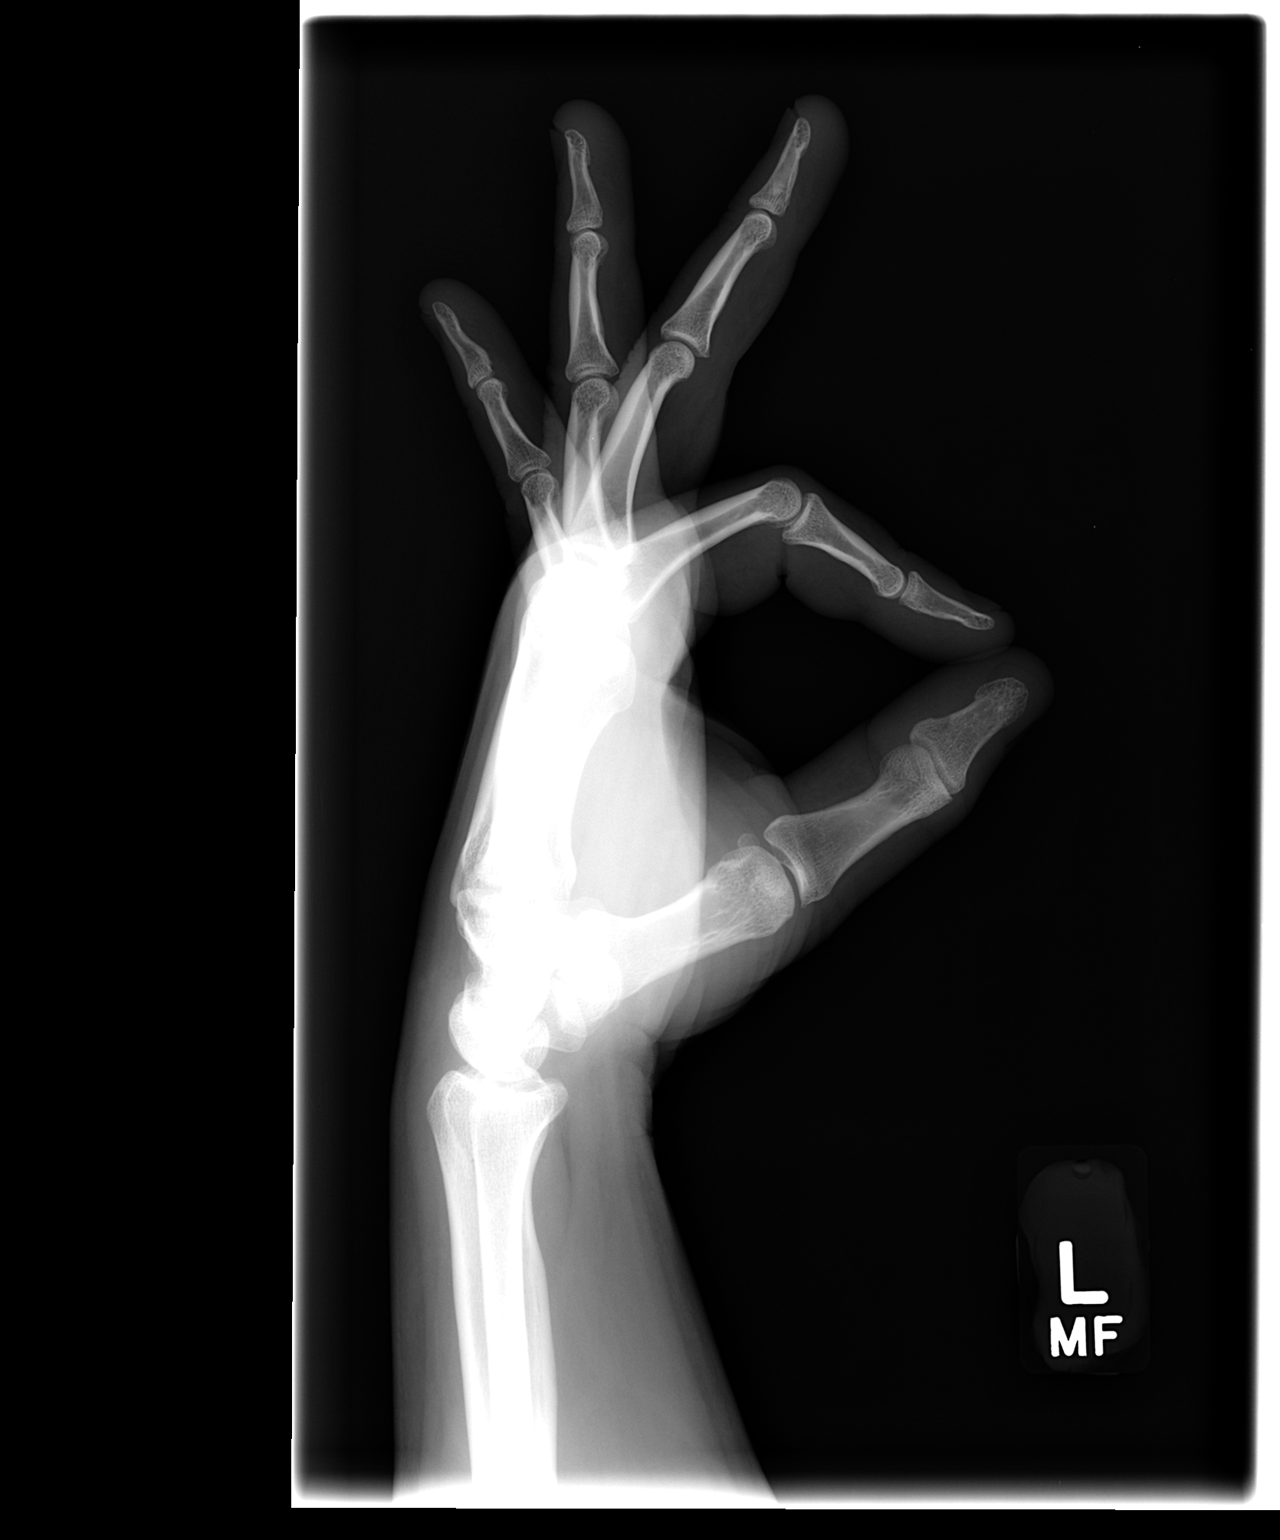

[3 of 3 positions shown; findings below may reference images not displayed]

FINDINGS: Three views of the left hand submitted.  No acute
fracture or subluxation.  No radiopaque foreign body.
IMPRESSION: No acute fracture or subluxation.

## 2014-02-13 ENCOUNTER — Telehealth: Payer: Self-pay | Admitting: Family Medicine

## 2014-02-13 MED ORDER — PREDNISONE 20 MG PO TABS: 20.0000 mg | ORAL_TABLET | Freq: Two times a day (BID) | ORAL | Status: DC

## 2014-02-13 NOTE — Telephone Encounter (Signed)
Is it okay to refill?

## 2014-02-13 NOTE — Telephone Encounter (Signed)
Refill once 

## 2014-02-13 NOTE — Telephone Encounter (Signed)
Pt states dr. Caryl Neverburchette has previously written for rx predniSONE (DELTASONE) 20 MG tablet for joint pain in foot, pt states it has flared back up and he is needed another rx, [facility] [address]

## 2014-02-13 NOTE — Telephone Encounter (Signed)
Rx sent to pharmacy   

## 2014-08-13 ENCOUNTER — Other Ambulatory Visit: Payer: Self-pay | Admitting: Family Medicine

## 2016-01-30 ENCOUNTER — Emergency Department (INDEPENDENT_AMBULATORY_CARE_PROVIDER_SITE_OTHER)
Admission: EM | Admit: 2016-01-30 | Discharge: 2016-01-30 | Disposition: A | Discharge status: Home / Self Care | Payer: PRIVATE HEALTH INSURANCE | Source: Home / Self Care

## 2016-01-30 ENCOUNTER — Encounter: Payer: Self-pay | Admitting: *Deleted

## 2016-01-30 DIAGNOSIS — R51 Headache: Secondary | ICD-10-CM

## 2016-01-30 DIAGNOSIS — R110 Nausea: Secondary | ICD-10-CM | POA: Diagnosis not present

## 2016-01-30 DIAGNOSIS — R519 Headache, unspecified: Secondary | ICD-10-CM

## 2016-01-30 LAB — POCT CBC W AUTO DIFF (K'VILLE URGENT CARE)

## 2016-01-30 LAB — POCT URINALYSIS DIP (MANUAL ENTRY)
Bilirubin, UA: NEGATIVE
Blood, UA: NEGATIVE
Glucose, UA: NEGATIVE
Ketones, POC UA: NEGATIVE
Leukocytes, UA: NEGATIVE
Nitrite, UA: NEGATIVE
Protein Ur, POC: 30 — AB
Spec Grav, UA: 1.02 (ref 1.005–1.03)
Urobilinogen, UA: 0.2 (ref 0–1)
pH, UA: 7 (ref 5–8)

## 2016-01-30 MED ORDER — ONDANSETRON 8 MG PO TBDP: 8.0000 mg | ORAL_TABLET | Freq: Once | ORAL | Status: DC

## 2016-01-30 MED ORDER — ONDANSETRON 4 MG PO TBDP: ORAL_TABLET | ORAL | Status: DC

## 2016-01-30 NOTE — ED Notes (Addendum)
Pt c/o HA since yesterday. During the night developed chills, nausea and HA persisted. Taken 1 gram tylenol and 8mg  zofran @ 12pm today. Gave 600mg  IBF here. He is taking doxy 100mg  for treatment of Chlamydia and or Urethritis. Last day today, was on prednisone.

## 2016-01-30 NOTE — ED Provider Notes (Signed)
CSN: [id number]     Arrival date & time 01/30/16  1602 History   None    Chief Complaint  Patient presents with  . Headache  . Fever      HPI Comments: Patient complains of onset of headache yesterday morning, which became worse at 11:30pm last night with development of fevers, chills, and sweats.  This morning the headache persisted, and he developed nausea with fever throughout the day.  No abdominal pain.  No changes in bowel movements.  No urinary symptoms.  The history is provided by the patient.    Past Medical History  Diagnosis Date  . Psoriasis    History reviewed. No pertinent past surgical history. Family History  Problem Relation Age of Onset  . Hypertension Father   . Hyperlipidemia Neg Hx   . Heart attack Neg Hx   . Sudden death Neg Hx   . Diabetes Neg Hx    Social History  Substance Use Topics  . Smoking status: Never Smoker   . Smokeless tobacco: None  . Alcohol Use: No    Review of Systems  Constitutional: Positive for fever, chills, diaphoresis, activity change, appetite change and fatigue.  Eyes: Negative.   Respiratory: Negative.   Cardiovascular: Negative.   Gastrointestinal: Positive for nausea. Negative for vomiting, abdominal pain, diarrhea, constipation and blood in stool.  Genitourinary: Negative.   Musculoskeletal: Negative for myalgias, back pain, neck pain and neck stiffness.  Skin: Negative.   Neurological: Positive for headaches. Negative for dizziness, syncope, facial asymmetry, light-headedness and numbness.  All other systems reviewed and are negative.   Allergies  Review of patient's allergies indicates no known allergies.  Home Medications   Prior to Admission medications   Medication Sig Start Date End Date Taking? Authorizing Provider  ondansetron (ZOFRAN ODT) 4 MG disintegrating tablet Take one or 2 tabs by mouth Q6hr prn nausea 01/30/16   Lattie HawStephen A Beese, MD   Meds Ordered and Administered this Visit   Medications   ondansetron (ZOFRAN-ODT) disintegrating tablet 8 mg (not administered)    BP 111/73 mmHg  Pulse 81  Temp(Src) 100.1 F (37.8 C) (Oral)  Resp 16  Wt 212 lb (96.163 kg)  SpO2 98% No data found.   Physical Exam Nursing notes and Vital Signs reviewed. Appearance:  Patient appears stated age, and in no acute distress Eyes:  Pupils are equal, round, and reactive to light and accomodation.  Extraocular movement is intact.  Conjunctivae are not inflamed.  Fundi benign.  Mild photophobia  Ears:  Canals normal.  Tympanic membranes normal.  Nose:   Normal turbinates.  No sinus tenderness.   Mouth:  moist mucous membranes  Pharynx:  Normal Neck:  Supple.  No adenopathy  Lungs:  Clear to auscultation.  Breath sounds are equal.  Moving air well. Heart:  Regular rate and rhythm without murmurs, rubs, or gallops.  Abdomen:  Nontender without masses or hepatosplenomegaly.  Bowel sounds are present.  No CVA or flank tenderness.  Extremities:  No edema.  Skin:  No rash present.   Neurologic:  Cranial nerves 2 through 12 are normal.  Patellar, achilles, and elbow reflexes are normal.  Cerebellar function is intact (finger-to-nose and rapid alternating hand movement).  Gait and station are normal.    ED Course  Procedures none    Labs Reviewed  POCT URINALYSIS DIP (MANUAL ENTRY) - Abnormal; Notable for the following:    Protein Ur, POC =30 (*)    All other components within normal  limits  POCT CBC W AUTO DIFF ([facility]):  WBC 9.3; LY 19.2; MO 4.4; GR 76.4; Hgb 14.2; Platelets 234       MDM   1. Nausea without vomiting.  Normal white blood count reassuring; suspect viral syndrome   2. Nonintractable episodic headache, unspecified headache type    Administered Zofran ODT 8mg  PO.   Rx for Zofran ODT Begin clear liquids for about 12 hours, then may begin a BRAT diet (Bananas, Rice, Applesauce, Toast) when nausea and dizziness resolved.  Then gradually advance to a regular diet as  tolerated.   If symptoms become significantly worse during the night or over the weekend, proceed to the local emergency room.     Lattie HawStephen A Beese, MD 02/07/16 [phone]

## 2016-01-30 NOTE — Discharge Instructions (Signed)
Begin clear liquids for about 12 hours, then may begin a BRAT diet (Bananas, Rice, Applesauce, Toast) when nausea and dizziness resolved.  Then gradually advance to a regular diet as tolerated.   If symptoms become significantly worse during the night or over the weekend, proceed to the local emergency room.

## 2016-09-08 ENCOUNTER — Encounter: Payer: Self-pay | Admitting: Emergency Medicine

## 2016-09-08 ENCOUNTER — Emergency Department (INDEPENDENT_AMBULATORY_CARE_PROVIDER_SITE_OTHER)
Admission: EM | Admit: 2016-09-08 | Discharge: 2016-09-08 | Disposition: A | Discharge status: Home / Self Care | Payer: PRIVATE HEALTH INSURANCE | Source: Home / Self Care | Attending: Family Medicine | Admitting: Family Medicine

## 2016-09-08 DIAGNOSIS — H60392 Other infective otitis externa, left ear: Secondary | ICD-10-CM

## 2016-09-08 DIAGNOSIS — L409 Psoriasis, unspecified: Secondary | ICD-10-CM

## 2016-09-08 MED ORDER — CEPHALEXIN 500 MG PO CAPS: 500.0000 mg | ORAL_CAPSULE | Freq: Two times a day (BID) | ORAL | 0 refills | Status: DC

## 2016-09-08 NOTE — ED Provider Notes (Signed)
CSN: [id number]     Arrival date & time 09/08/16  1204 History   First MD Initiated Contact with Patient 09/08/16 1236     Chief Complaint  Patient presents with  . Cellulitis   (Consider location/radiation/quality/duration/timing/severity/associated sxs/prior Treatment) HPI  Bernard Manning is a 41 y.o. male presenting to UC with c/o 2 days of Left sided neck soreness that started about 4 days ago but worse over the last 2 days with redness and swelling of Left earlobe, surrounding red dried skin. Hx of psoriasis. He notes he does pick at his dry skin behind his ear. He has steroid cream at home but was hesitant to use at this time as he is concerned about a potential bacterial infection on his neck. Denies fever, chills, n/v/d. Denies pain to his earlobe but does have mild burning to his neck.   Pt notes his "doctor friend" put him on doxycycline but he has not had much relief so his friend called in 4 tabs of a cephalosporin, pt question if he needs more antibiotic.    Past Medical History:  Diagnosis Date  . Psoriasis    History reviewed. No pertinent surgical history. Family History  Problem Relation Age of Onset  . Hypertension Father   . Hyperlipidemia Neg Hx   . Heart attack Neg Hx   . Sudden death Neg Hx   . Diabetes Neg Hx    Social History  Substance Use Topics  . Smoking status: Never Smoker  . Smokeless tobacco: Never Used  . Alcohol use No    Review of Systems  Constitutional: Negative for chills and fever.  Gastrointestinal: Negative for abdominal pain, nausea and vomiting.  Musculoskeletal: Negative for myalgias.  Skin: Positive for color change, rash and wound.  Neurological: Negative for dizziness, light-headedness and headaches.    Allergies  Patient has no known allergies.  Home Medications   Prior to Admission medications   Medication Sig Start Date End Date Taking? Authorizing Provider  doxycycline (VIBRA-TABS) 100 MG tablet Take 100 mg by mouth 2  (two) times daily.   Yes Historical Provider, MD  cephALEXin (KEFLEX) 500 MG capsule Take 1 capsule (500 mg total) by mouth 2 (two) times daily. For 7 days 09/08/16   Junius Finner, PA-C  ondansetron (ZOFRAN ODT) 4 MG disintegrating tablet Take one or 2 tabs by mouth Q6hr prn nausea 01/30/16   Lattie Haw, MD   Meds Ordered and Administered this Visit  Medications - No data to display  BP 120/83 (BP Location: Left Arm)   Pulse 76   Temp 98.2 F (36.8 C) (Oral)   Ht 5\' 9"  (1.753 m)   Wt 217 lb (98.4 kg)   SpO2 100%   BMI 32.05 kg/m  No data found.   Physical Exam  Constitutional: He is oriented to person, place, and time. He appears well-developed and well-nourished. No distress.  HENT:  Head: Normocephalic and atraumatic.  Right Ear: Tympanic membrane normal.  Left Ear: Tympanic membrane normal. No tenderness.  Ears:  Left earlobe: mild edema, erythema, overlying dried skin. No bleeding or discharge.  Left side of head, behind ear: 1cm area of erythematous dried patch of skin. Non-tender. No induration or fluctuance. No bleeding or discharge.   Eyes: EOM are normal.  Neck: Normal range of motion. Neck supple.  No obvious neck swelling or skin changes   Cardiovascular: Normal rate.   Pulmonary/Chest: Effort normal. No stridor.  Musculoskeletal: Normal range of motion.  Lymphadenopathy:  He has no cervical adenopathy.  Neurological: He is alert and oriented to person, place, and time.  Skin: Skin is warm and dry. Rash noted. He is not diaphoretic. There is erythema.  Psychiatric: He has a normal mood and affect. His behavior is normal.  Nursing note and vitals reviewed.   Urgent Care Course     Procedures (including critical care time)  Labs Review Labs Reviewed - No data to display  Imaging Review No results found.    MDM   1. Skin of left earlobe with infection   2. Psoriasis    Pt c/o redness and swelling to Left earlobe and soreness to Left side of  neck. Mild edema to ear lobe and erythematous dried rash behind Left ear c/w pt's psoriasis. Pt notes he is currently already on doxycycline, however, concerned the redness to earlobe has not improved. Will add Keflex 500mg  BID for 7 days. No evidence of abscess at this time and no evidence of cellulitis or other skin abnormalities on Left side of neck. Encouraged f/u with dermatology in 1 week if not improving as they may need to do a skin biopsy.      Junius Finnerrin O'Malley, PA-C 09/08/16 1423

## 2016-09-08 NOTE — Discharge Instructions (Signed)
°  It is important to keep skin clean with warm water and gentle soap. Pat dry the area, do not rub. Avoid picking at skin as this can cause small openings in the skin, leading to infection. You may try your steroid cream as prescribed to help with inflammation and skin dryness while taking your antibiotics. Be sure to take the entire course of antibiotics to help prevent infection from coming back. If skin is still irritated and swollen, please follow up with a dermatologist as you may need a skin biopsy to make sure it is not a fungal infection or other cause of recurrent skin irritation.

## 2016-09-08 NOTE — ED Triage Notes (Signed)
Left ear lobe red, swollen, burning and burning sensation radiating down left side of neck x 2 days. Denies trauma, he says he does have hx of psoriasis.

## 2016-10-14 ENCOUNTER — Emergency Department (INDEPENDENT_AMBULATORY_CARE_PROVIDER_SITE_OTHER): Payer: PRIVATE HEALTH INSURANCE

## 2016-10-14 ENCOUNTER — Emergency Department
Admission: EM | Admit: 2016-10-14 | Discharge: 2016-10-14 | Disposition: A | Discharge status: Home / Self Care | Payer: PRIVATE HEALTH INSURANCE | Source: Home / Self Care | Attending: Family Medicine | Admitting: Family Medicine

## 2016-10-14 DIAGNOSIS — S63612A Unspecified sprain of right middle finger, initial encounter: Secondary | ICD-10-CM | POA: Diagnosis not present

## 2016-10-14 DIAGNOSIS — S62605A Fracture of unspecified phalanx of left ring finger, initial encounter for closed fracture: Secondary | ICD-10-CM

## 2016-10-14 DIAGNOSIS — Y9375 Activity, martial arts: Secondary | ICD-10-CM | POA: Diagnosis not present

## 2016-10-14 NOTE — Discharge Instructions (Signed)
May take Ibuprofen 200mg , 4 tabs every 8 hours with food.  Begin range of motion and stretching exercises as tolerated.   Put ice in a plastic bag. Place a towel between your skin and the bag. Leave the ice on for 20 minutes, 2-3 times a day.

## 2016-10-14 NOTE — ED Provider Notes (Signed)
[facility]    CSN: [id number] Arrival date & time: 10/14/16  1641     History   Chief Complaint Chief Complaint  Patient presents with  . Finger Injury    HPI Bernard Manning is a 41 y.o. male.   Patient injured his right third finger while participating in martial arts last night.   The history is provided by the patient.  Hand Pain  This is a new problem. The current episode started yesterday. The problem occurs constantly. The problem has not changed since onset.Exacerbated by: flexing finger. Nothing relieves the symptoms. Treatments tried: ice. The treatment provided mild relief.    Past Medical History:  Diagnosis Date  . Psoriasis     Patient Active Problem List   Diagnosis Date Noted  . Right elbow pain 06/23/2012  . 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 PSORIASIS 06/12/2010  . ARTHRALGIA 06/12/2010    History reviewed. No pertinent surgical history.     Home Medications    Prior to Admission medications   Medication Sig Start Date End Date Taking? Authorizing Provider  cephALEXin (KEFLEX) 500 MG capsule Take 1 capsule (500 mg total) by mouth 2 (two) times daily. For 7 days 09/08/16   Junius FinnerErin O'Malley, PA-C  doxycycline (VIBRA-TABS) 100 MG tablet Take 100 mg by mouth 2 (two) times daily.    Historical Provider, MD  ondansetron (ZOFRAN ODT) 4 MG disintegrating tablet Take one or 2 tabs by mouth Q6hr prn nausea 01/30/16   Lattie HawStephen A Beese, MD    Family History Family History  Problem Relation Age of Onset  . Hypertension Father   . Hyperlipidemia Neg Hx   . Heart attack Neg Hx   . Sudden death Neg Hx   . Diabetes Neg Hx     Social History Social History  Substance Use Topics  . Smoking status: Never Smoker  . Smokeless tobacco: Never Used  . Alcohol use No     Allergies   Patient has no known allergies.   Review of Systems Review of Systems  All other systems reviewed and are negative.    Physical Exam Triage Vital Signs ED  Triage Vitals  Enc Vitals Group     BP 10/14/16 1713 120/82     Pulse Rate 10/14/16 1713 (!) 58     Resp --      Temp 10/14/16 1713 98.4 F (36.9 C)     Temp Source 10/14/16 1713 Oral     SpO2 10/14/16 1713 99 %     Weight 10/14/16 1714 221 lb (100.2 kg)     Height 10/14/16 1714 5\' 9"  (1.753 m)     Head Circumference --      Peak Flow --      Pain Score 10/14/16 1715 4     Pain Loc --      Pain Edu? --      Excl. in GC? --    No data found.   Updated Vital Signs BP 120/82 (BP Location: Left Arm)   Pulse (!) 58   Temp 98.4 F (36.9 C) (Oral)   Ht 5\' 9"  (1.753 m)   Wt 221 lb (100.2 kg)   SpO2 99%   BMI 32.64 kg/m   Visual Acuity Right Eye Distance:   Left Eye Distance:   Bilateral Distance:    Right Eye Near:   Left Eye Near:    Bilateral Near:     Physical Exam  Constitutional: He appears well-developed and well-nourished. No distress.  HENT:  Head: Atraumatic.  Eyes: Pupils are equal, round, and reactive to light.  Cardiovascular: Normal rate.   Pulmonary/Chest: Effort normal.  Musculoskeletal:       Right hand: He exhibits decreased range of motion, tenderness and bony tenderness. He exhibits normal two-point discrimination, normal capillary refill, no deformity, no laceration and no swelling. Normal sensation noted. Normal strength noted.       Hands: Right third finger has decreased range of motion to full flexion.  Tenderness over the PIP joint, especially collateral ligaments.  PIP joint stable.  Flexion/extension intact all joints.   Neurological: He is alert.  Skin: Skin is warm and dry.  Nursing note and vitals reviewed.    UC Treatments / Results  Labs (all labs ordered are listed, but only abnormal results are displayed) Labs Reviewed - No data to display  EKG  EKG Interpretation None       Radiology Dg Finger Middle Right  Result Date: 10/14/2016 CLINICAL DATA:  41 y/o M; injury of right middle finger with swelling around the proximal  interphalangeal joint. EXAM: RIGHT MIDDLE FINGER 2+V COMPARISON:  None. FINDINGS: There is no evidence of fracture or dislocation. There is no evidence of arthropathy or other focal bone abnormality. Soft tissues are unremarkable. IMPRESSION: No acute fracture or dislocation identified. Electronically Signed   By: Mitzi Hansen M.D.   On: 10/14/2016 17:20    Procedures Procedures (including critical care time)  Medications Ordered in UC Medications - No data to display   Initial Impression / Assessment and Plan / UC Course  I have reviewed the triage vital signs and the nursing notes.  Pertinent labs & imaging results that were available during my care of the patient were reviewed by me and considered in my medical decision making (see chart for details).    May take Ibuprofen 200mg , 4 tabs every 8 hours with food.  Begin range of motion and stretching exercises as tolerated.    Put ice in a plastic bag.  Place a towel between your skin and the bag.  Leave the ice on for 20 minutes, 2-3 times a day.    Final Clinical Impressions(s) / UC Diagnoses   Final diagnoses:  Sprain of right middle finger, initial encounter    New Prescriptions New Prescriptions   No medications on file     Lattie Haw, MD 10/21/16 2011

## 2016-10-14 NOTE — ED Triage Notes (Signed)
Pt hurt right middle finger at martial arts last night.  Pain, swelling, and bruising noted in finger.

## 2016-10-17 ENCOUNTER — Emergency Department (HOSPITAL_COMMUNITY): Payer: PRIVATE HEALTH INSURANCE

## 2016-10-17 ENCOUNTER — Encounter (HOSPITAL_COMMUNITY): Payer: Self-pay

## 2016-10-17 ENCOUNTER — Emergency Department (HOSPITAL_COMMUNITY)
Admission: EM | Admit: 2016-10-17 | Discharge: 2016-10-17 | Disposition: A | Discharge status: Home / Self Care | Payer: PRIVATE HEALTH INSURANCE | Attending: Emergency Medicine | Admitting: Emergency Medicine

## 2016-10-17 DIAGNOSIS — Y9389 Activity, other specified: Secondary | ICD-10-CM | POA: Insufficient documentation

## 2016-10-17 DIAGNOSIS — W500XXA Accidental hit or strike by another person, initial encounter: Secondary | ICD-10-CM | POA: Insufficient documentation

## 2016-10-17 DIAGNOSIS — Y929 Unspecified place or not applicable: Secondary | ICD-10-CM | POA: Diagnosis not present

## 2016-10-17 DIAGNOSIS — S62615A Displaced fracture of proximal phalanx of left ring finger, initial encounter for closed fracture: Secondary | ICD-10-CM | POA: Insufficient documentation

## 2016-10-17 DIAGNOSIS — Y999 Unspecified external cause status: Secondary | ICD-10-CM | POA: Diagnosis not present

## 2016-10-17 DIAGNOSIS — S6992XA Unspecified injury of left wrist, hand and finger(s), initial encounter: Secondary | ICD-10-CM | POA: Diagnosis present

## 2016-10-17 MED ORDER — LIDOCAINE HCL (PF) 1 % IJ SOLN
5.0000 mL | Freq: Once | INTRAMUSCULAR | Status: DC
  Filled 2016-10-17: qty 5

## 2016-10-17 NOTE — Discharge Instructions (Signed)
As discussed today, keep your splint on. Ibuprofen for pain. Follow up with hand specialist. If you experience any worsening until then, increased pain, swelling, numbness or any other concerning symptoms, return to the emergency department.

## 2016-10-17 NOTE — ED Triage Notes (Signed)
Patient here with dislocation of left hand ring finger while doing martial arts this am, positive distal pulses and sensation. Xray from triage

## 2016-10-17 NOTE — ED Provider Notes (Signed)
Medical screening examination/treatment/procedure(s) were conducted as a shared visit with non-physician practitioner(s) and myself.  I personally evaluated the patient during the encounter.   EKG Interpretation None      Results for orders placed or performed during the hospital encounter of 01/30/16  POCT urinalysis dipstick (new)  Result Value Ref Range   Color, UA yellow yellow   Clarity, UA clear clear   Glucose, UA negative negative   Bilirubin, UA negative negative   Ketones, POC UA negative negative   Spec Grav, UA 1.020 1.005 - 1.03   Blood, UA negative negative   pH, UA 7.0 5 - 8   Protein Ur, POC =30 (A) negative   Urobilinogen, UA 0.2 0 - 1   Nitrite, UA Negative Negative   Leukocytes, UA Negative Negative  CBC w auto diff ([facility])  Result Value Ref Range   WBC  4.5 - 10.5 K/uL   Lymphocytes relative %  15 - 45 %   Monocytes relative %  2 - 10 %   Neutrophils relative % (GR)  44 - 76 %   Lymphocytes absolute  0.1 - 1.8 K/uL   Monocyes absolute  0.1 - 1 K/uL   Neutrophils absolute (GR#)  1.7 - 7.7 K/uL   RBC  4.2 - 5.8 MIL/uL   Hemoglobin  13 - 17 g/dL   Hematocrit  16.138.5 - 51 %   MCV  80 - 98 fL   MCH  26.5 - 32.5 pg   MCHC  32.5 - 36.9 g/dL   RDW  09.611.6 - 14 %   Platelet count  140 - 400 K/uL   MPV  7.8 - 11 fL   Dg Hand Complete Left  Result Date: 10/17/2016 CLINICAL DATA:  Patient here with dislocation of left ring finger while doing martial arts this am. EXAM: LEFT HAND - COMPLETE 3+ VIEW COMPARISON:  06/18/2012 FINDINGS: Volar plate avulsion fragment from the palmar base of the middle phalanx, distracted at least 9 mm. Dorsal dislocation of the PIP joint. Normal mineralization. No significant osseous degenerative change elsewhere. Regional soft tissues unremarkable. IMPRESSION: 1. Fracture dislocation, left ring finger PIP joint. Electronically Signed   By: Corlis Leak  Hassell M.D.   On: 10/17/2016 12:07   Dg Finger Middle Right  Result Date:  10/14/2016 CLINICAL DATA:  41 y/o M; injury of right middle finger with swelling around the proximal interphalangeal joint. EXAM: RIGHT MIDDLE FINGER 2+V COMPARISON:  None. FINDINGS: There is no evidence of fracture or dislocation. There is no evidence of arthropathy or other focal bone abnormality. Soft tissues are unremarkable. IMPRESSION: No acute fracture or dislocation identified. Electronically Signed   By: Mitzi HansenLance  Furusawa-Stratton M.D.   On: 10/14/2016 17:20     Patient seen by me along with the physician assistant. The patient was doing martial arts when he injured his left hand ring finger. Clearly has deformity. X-ray shows that there is also a fracture dislocation. Not open it is closed. We'll do finger blocking given attempt to reduce it and is getting good chance staying back in place that'll be splinted and follow-up with hand surgery.  Patient without any other injuries. Good cap refill to the tip of the finger.   Vanetta MuldersScott Tuwanda Vokes, MD 10/17/16 1323

## 2016-10-17 NOTE — ED Notes (Addendum)
Patient in xray when called for treatment room

## 2016-10-17 NOTE — ED Provider Notes (Signed)
[facility] Provider Note   CSN: [id number] Arrival date & time: 10/17/16  1129     History   Chief Complaint No chief complaint on file.   HPI Bernard Manning is a 41 y.o. male otherwise healthy presenting after acute left ring finger injury during martial arts fighting earlier today. He explains that he was reaching out with stretched fingers and hit his opponent's leg and dislocated his left ring finger. He denies any numbness, tingling. He took Aleve and Tylenol earlier with some relief and refuses anything for pain at this time. Denies any other symptoms.  HPI  Past Medical History:  Diagnosis Date  . Psoriasis     Patient Active Problem List   Diagnosis Date Noted  . Right elbow pain 06/23/2012  . 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 PSORIASIS 06/12/2010  . ARTHRALGIA 06/12/2010    History reviewed. No pertinent surgical history.     Home Medications    Prior to Admission medications   Medication Sig Start Date End Date Taking? Authorizing Provider  calcipotriene-betamethasone [facility] SCALP) external suspension Apply 1 application topically 2 (two) times daily.   Yes Historical Provider, MD  NON FORMULARY Take 2 g by mouth daily. Black Ginger   Yes Historical Provider, MD  cephALEXin (KEFLEX) 500 MG capsule Take 1 capsule (500 mg total) by mouth 2 (two) times daily. For 7 days Patient not taking: Reported on 10/17/2016 09/08/16   Junius Finner, PA-C    Family History Family History  Problem Relation Age of Onset  . Hypertension Father   . Hyperlipidemia Neg Hx   . Heart attack Neg Hx   . Sudden death Neg Hx   . Diabetes Neg Hx     Social History Social History  Substance Use Topics  . Smoking status: Never Smoker  . Smokeless tobacco: Never Used  . Alcohol use No     Allergies   Patient has no known allergies.   Review of Systems Review of Systems  Gastrointestinal: Negative for nausea and vomiting.  Musculoskeletal: Positive for  arthralgias and joint swelling.       Patient presents with dislocation of the left fourth digit with lateral deviation at the PIP  Skin: Negative for color change and pallor.  Neurological: Negative for tremors, weakness and numbness.     Physical Exam Updated Vital Signs BP 114/79 (BP Location: Right Arm)   Pulse 60   Temp 98.7 F (37.1 C) (Oral)   Resp 16   SpO2 99%   Physical Exam  Constitutional: He appears well-developed and well-nourished. No distress.  Afebrile nontoxic sitting comfortably in bed in no acute distress  HENT:  Head: Normocephalic and atraumatic.  Eyes: EOM are normal.  Cardiovascular: Normal rate, regular rhythm, normal heart sounds and intact distal pulses.   No murmur heard. Pulmonary/Chest: Effort normal and breath sounds normal. No respiratory distress. He has no wheezes.  Musculoskeletal: He exhibits edema, tenderness and deformity.  Neurological: He is alert. No sensory deficit.  Skin: Skin is warm and dry. Capillary refill takes less than 2 seconds. He is not diaphoretic. No erythema. No pallor.  Psychiatric: He has a normal mood and affect.  Nursing note and vitals reviewed.    ED Treatments / Results  Labs (all labs ordered are listed, but only abnormal results are displayed) Labs Reviewed - No data to display  EKG  EKG Interpretation None       Radiology Dg Hand Complete Left  Result Date: 10/17/2016 CLINICAL DATA:  Patient here with dislocation of left ring finger while doing martial arts this am. EXAM: LEFT HAND - COMPLETE 3+ VIEW COMPARISON:  06/18/2012 FINDINGS: Volar plate avulsion fragment from the palmar base of the middle phalanx, distracted at least 9 mm. Dorsal dislocation of the PIP joint. Normal mineralization. No significant osseous degenerative change elsewhere. Regional soft tissues unremarkable. IMPRESSION: 1. Fracture dislocation, left ring finger PIP joint. Electronically Signed   By: Corlis Leak  Hassell M.D.   On: 10/17/2016  12:07   Dg Finger Ring Left  Result Date: 10/17/2016 CLINICAL DATA:  41 year old male status post reduction of left fourth finger . EXAM: LEFT RING FINGER 2+V COMPARISON:  1154 hours today. FINDINGS: Two views of the left fourth finger. Reduced previously-seen dorsal fourth PIP dislocation. There is a small acute volar fracture fragment which appears to be off of the volar base of the middle phalanx (lateral view). No other acute fracture identified. Other joint spaces appear normal. IMPRESSION: Reduced fourth PIP dislocation with small acute volar fracture fragment which appears to be from the volar base of the fourth middle phalanx. Electronically Signed   By: Odessa FlemingH  Hall M.D.   On: 10/17/2016 15:27    Procedures Procedures (including critical care time)  NERVE BLOCK Performed by: Georgiana ShoreJessica B Mitchell Consent: Verbal consent obtained. Required items: required blood products, implants, devices, and special equipment available Time out: Immediately prior to procedure a "time out" was called to verify the correct patient, procedure, equipment, support staff and site/side marked as required.  Indication: dislocation requiring reduction Nerve block body site: left 4th digit  Preparation: Patient was prepped and draped in the usual sterile fashion. Needle gauge: 24 G Location technique: anatomical landmarks  Local anesthetic: lidocaine 1% w/out epi   Anesthetic total: 4 ml  Outcome: pain improved Patient tolerance: Patient tolerated the procedure well with no immediate complications.   Reduction of dislocation Date/Time: 4:15 PM Performed by: Georgiana ShoreJessica B Mitchell Authorized by: Georgiana ShoreJessica B Mitchell Consent: Verbal consent obtained. Risks and benefits: risks, benefits and alternatives were discussed Consent given by: patient Required items: required blood products, implants, devices, and special equipment available Time out: Immediately prior to procedure a "time out" was called to verify the  correct patient, procedure, equipment, support staff and site/side marked as required.  Patient sedated: no  Vitals: Vital signs were monitored  Patient tolerance: Patient tolerated the procedure well with no immediate complications. Joint: PIP of left 4th digit Reduction technique: manual traction   Medications Ordered in ED Medications  lidocaine (PF) (XYLOCAINE) 1 % injection 5 mL (not administered)     Initial Impression / Assessment and Plan / ED Course  I have reviewed the triage vital signs and the nursing notes.  Pertinent labs & imaging results that were available during my care of the patient were reviewed by me and considered in my medical decision making (see chart for details).     Patient presents with fx and dislocation at the left PIP of 4th left digit.  Patient was discussed with Dr. Deretha EmoryZackowski who has seen patient and recommends attempt at reducing. Patient's finger was successfully reduced and re-imaged with proper alignment. Pain was managed while in Ed. Good cap refill and sensation after splint. Dc home with close follow up with hand specialist.  Discussed strict return precautions. Patient was advised to return to the emergency department if experiencing any new or worsening symptoms. Patient clearly understood instructions and agreed with discharge plan.  Final Clinical Impressions(s) / ED Diagnoses  Final diagnoses:  Closed displaced fracture of proximal phalanx of left ring finger, initial encounter    New Prescriptions New Prescriptions   No medications on file     Georgiana Shore, Cordelia Poche 10/17/16 1629    Vanetta Mulders, MD 10/21/16 [phone]

## 2016-11-01 ENCOUNTER — Ambulatory Visit (INDEPENDENT_AMBULATORY_CARE_PROVIDER_SITE_OTHER): Payer: PRIVATE HEALTH INSURANCE | Admitting: Family Medicine

## 2016-11-01 ENCOUNTER — Ambulatory Visit (INDEPENDENT_AMBULATORY_CARE_PROVIDER_SITE_OTHER): Payer: PRIVATE HEALTH INSURANCE

## 2016-11-01 VITALS — BP 125/61 | HR 68 | Wt 218.0 lb

## 2016-11-01 DIAGNOSIS — S62615D Displaced fracture of proximal phalanx of left ring finger, subsequent encounter for fracture with routine healing: Secondary | ICD-10-CM | POA: Diagnosis not present

## 2016-11-01 DIAGNOSIS — S6992XA Unspecified injury of left wrist, hand and finger(s), initial encounter: Secondary | ICD-10-CM

## 2016-11-01 DIAGNOSIS — Y9375 Activity, martial arts: Secondary | ICD-10-CM

## 2016-11-01 NOTE — Progress Notes (Signed)
    Subjective:    I'm seeing this patient as a consultation for:  Dr Vanetta MuldersScott Zackowski. CC: Bernard Manning,BRUCE W, MD   CC: Finger injury  HPI: Patient suffered a fracture dislocation of the left fourth digit at the PIP about 2 weeks ago. He was seen in the emergency department on March 11 where x-rays showed a dislocated PIP with a volar plate fracture. This was reduced and placed into a aluminum splint. He's here today for follow-up. He notes it is feeling pretty well and is essentially pain-free.  Past medical history, Surgical history, Family history not pertinant except as noted below, Social history, Allergies, and medications have been entered into the medical record, reviewed, and no changes needed.   Review of Systems: No headache, visual changes, nausea, vomiting, diarrhea, constipation, dizziness, abdominal pain, skin rash, fevers, chills, night sweats, weight loss, swollen lymph nodes, body aches, joint swelling, muscle aches, chest pain, shortness of breath, mood changes, visual or auditory hallucinations.   Objective:    Vitals:   11/01/16 1522  BP: 125/61  Pulse: 68   General: Well Developed, well nourished, and in no acute distress.  Neuro/Psych: Alert and oriented x3, extra-ocular muscles intact, able to move all 4 extremities, sensation grossly intact. Skin: Warm and dry, no rashes noted.  Respiratory: Not using accessory muscles, speaking in full sentences, trachea midline.  Cardiovascular: Pulses palpable, no extremity edema. Abdomen: Does not appear distended. MSK: Left hand well-appearing no obvious deformity. Mildly tender to palpation volarly PIP left fourth digit. Pulses capillary refill sensation intact distally. No rotational deformity.  No results found for this or any previous visit (from the past 24 hour(s)). Dg Finger Ring Left  Result Date: 11/01/2016 CLINICAL DATA:  History of volar plate avulsion fracture at the PIP joint of the left ring finger.  Subsequent encounter. EXAM: LEFT RING FINGER 2+V COMPARISON:  Plain films left ring finger 10/17/2016. FINDINGS: Displaced volar plate avulsion fracture of the base of the middle phalanx of the ring finger is again seen. Position and alignment are unchanged. Fracture margins at the donor site are healing. No new abnormality. IMPRESSION: No change in a displaced volar plate avulsion fracture of the base of the middle phalanx left ring finger. Fracture margins at the fracture fragment donor site are healing. Electronically Signed   By: Drusilla Kannerhomas  Dalessio M.D.   On: 11/01/2016 15:54    Impression and Recommendations:    Assessment and Plan: 41 y.o. male with volar plate fracture with good anatomical alignment of phalanx. Plan to place PIP and a bit of flexion and allow normal motion of the DIP and MCP. Recheck in 1 week. We'll continue to work on range of motion.   Discussed warning signs or symptoms. Please see discharge instructions. Patient expresses understanding.  CC: Bernard Manning,BRUCE W, MD

## 2016-11-01 NOTE — Patient Instructions (Signed)
Thank you for coming in today. Recheck in 1-2 weeks.  Continue splint across the injured joint.  Allow flexion of the tip of the finger and the knuckle at the fist.

## 2016-11-11 ENCOUNTER — Ambulatory Visit (INDEPENDENT_AMBULATORY_CARE_PROVIDER_SITE_OTHER): Payer: PRIVATE HEALTH INSURANCE | Admitting: Family Medicine

## 2016-11-11 ENCOUNTER — Ambulatory Visit (INDEPENDENT_AMBULATORY_CARE_PROVIDER_SITE_OTHER): Payer: PRIVATE HEALTH INSURANCE

## 2016-11-11 ENCOUNTER — Encounter: Payer: Self-pay | Admitting: Family Medicine

## 2016-11-11 DIAGNOSIS — Y9375 Activity, martial arts: Secondary | ICD-10-CM | POA: Diagnosis not present

## 2016-11-11 DIAGNOSIS — S62625D Displaced fracture of medial phalanx of left ring finger, subsequent encounter for fracture with routine healing: Secondary | ICD-10-CM

## 2016-11-11 DIAGNOSIS — S62625A Displaced fracture of medial phalanx of left ring finger, initial encounter for closed fracture: Secondary | ICD-10-CM | POA: Diagnosis not present

## 2016-11-11 DIAGNOSIS — S62609A Fracture of unspecified phalanx of unspecified finger, initial encounter for closed fracture: Secondary | ICD-10-CM | POA: Insufficient documentation

## 2016-11-11 NOTE — Progress Notes (Signed)
   Bernard Manning is a 41 y.o. male who presents to [facility] today for a finger fracture. Patient was seen March 26 for a fracture of the left fourth digit. He had a volar plate fracture at the PIP. He's been using a dorsal splint since and feels well. He notes the pain is much better but still mildly present at the PIP.   Past Medical History:  Diagnosis Date  . Psoriasis    No past surgical history on file. Social History  Substance Use Topics  . Smoking status: Never Smoker  . Smokeless tobacco: Never Used  . Alcohol use No     ROS:  As above   Medications: No current outpatient prescriptions on file.   No current facility-administered medications for this visit.    No Known Allergies   Exam:  BP 114/72   Pulse (!) 55   Wt 216 lb (98 kg)   BMI 31.90 kg/m  General: Well Developed, well nourished, and in no acute distress.  Neuro/Psych: Alert and oriented x3, extra-ocular muscles intact, able to move all 4 extremities, sensation grossly intact. Skin: Warm and dry, no rashes noted.  Respiratory: Not using accessory muscles, speaking in full sentences, trachea midline.  Cardiovascular: Pulses palpable, no extremity edema. Abdomen: Does not appear distended. MSK: Bernard Manning is well-appearing with slight swelling at the PIP. Motion is decreased at the PIP. Motion is intact at the DIP and MCP. Sensation and capillary refill are intact.    No results found for this or any previous visit (from the past 48 hour(s)). Dg Finger Ring Left  Result Date: 11/11/2016 CLINICAL DATA:  Follow-up volar plate fracture left fourth finger EXAM: LEFT RING FINGER 2+V COMPARISON:  11/01/2016 FINDINGS: Again noted avulsed fracture volar plate at the base of middle phalanx left fourth finger. Fracture donor site appears healed. The displaced avulsed bony fragment measures 2.2 mm well corticated located just volar aspect next to proximal interphalangeal  joint. IMPRESSION: Again noted avulsed fracture volar plate at the base of middle phalanx left fourth finger. Fracture donor site appears healed. The displaced avulsed bony fragment measures 2.2 mm well corticated located just volar aspect next to proximal interphalangeal joint. Best seen on lateral view. Electronically Signed   By: Natasha Mead M.D.   On: 11/11/2016 12:21      Assessment and Plan: 41 y.o. male with volar plate fracture doing well. Continue flexion splinting with motion allowed at the DIP and MCP. In about a week or 2 we'll move to buddy tape. Recheck in 2-4 weeks.    Orders Placed This Encounter  Procedures  . DG Finger Ring Left    Order Specific Question:   Reason for exam:    Answer:   eval fx    Order Specific Question:   Preferred imaging location?    Answer:   [facility]    Discussed warning signs or symptoms. Please see discharge instructions. Patient expresses understanding.

## 2016-11-11 NOTE — Patient Instructions (Signed)
Thank you for coming in today. In a week or so if pain allows work to buddy tape and work on gentle hand motion.  Recheck in 2-3 weeks or so.    How to Buddy Tape Buddy taping refers to taping an injured finger or toe to an uninjured finger or toe that is next to it. This protects the injured finger or toe and keeps it from moving while the injury heals. You may buddy tape a finger or toe if you have a minor sprain. Your health care provider may buddy tape your finger or toe if you have a sprain, dislocation, or fracture. You may be told to replace your buddy taping as needed. What are the risks? Generally, buddy taping is safe. However, problems may occur, such as:  Skin injury or infection.  Reduced blood flow to the finger or toe.  Skin reaction to the tape. Do not buddy tape your toe if you have diabetes. Do not buddy tape if you know that you have an allergy to adhesives or surgical tape. How to buddy tape Before Buddy Taping  Try to reduce any pain and swelling with rest, icing, and elevation:  Avoid any activity that causes pain.  Raise (elevate) your hand or foot above the level of your heart while you are sitting or lying down.  If directed, apply ice to the injured area:  Put ice in a plastic bag.  Place a towel between your skin and the bag.  Leave the ice on for 20 minutes, 2-3 times per day. Buddy Taping Procedure   Clean and dry your finger or toe as told by your health care provider.  Place a gauze pad or a piece of cloth or cotton between your injured finger or toe and the uninjured finger or toe.  Use tape to wrap around both fingers or toes so your injured finger or toe is secured to the uninjured finger or toe.  The tape should be snug, but not tight.  Make sure the ends of the piece of tape overlap.  Avoid placing tape directly over the joint.  Change the tape and the padding as told by your health care provider. Remove and replace the tape or padding  if it becomes loose, worn, dirty, or wet. After Buddy Taping   Take over-the-counter and prescription medicines only as told by your health care provider.  Return to your normal activities as told by your health care provider. Ask your health care provider what activities are safe for you.  Watch the buddy-taped area and always remove buddy taping if:  Your pain gets worse.  Your fingers turn pale or blue.  Your skin becomes irritated. Contact a health care provider if:  You have pain, swelling, or bruising that lasts longer than three days.  You have a fever.  Your skin is red, cracked, or irritated. Get help right away if:  The injured area becomes cold, numb, or pale.  You have severe pain, swelling, bruising, or loss of movement in your finger or toe.  Your finger or toe changes shape (deformity). This information is not intended to replace advice given to you by your health care provider. Make sure you discuss any questions you have with your health care provider. Document Released: 09/02/2004 Document Revised: 01/01/2016 Document Reviewed: 12/18/2014 Elsevier Interactive Patient Education  2017 [employer].

## 2016-11-26 ENCOUNTER — Ambulatory Visit: Payer: PRIVATE HEALTH INSURANCE | Admitting: Family Medicine

## 2018-06-06 IMAGING — DX DG FINGER RING 2+V*L*
3 series · 3 of 3 positions shown · non-contrast
Comparison: Plain films left ring finger 10/17/2016.

CLINICAL DATA: History of volar plate avulsion fracture at the PIP
joint of the left ring finger. Subsequent encounter.

EXAM:
LEFT RING FINGER 2+V

[finger ap]
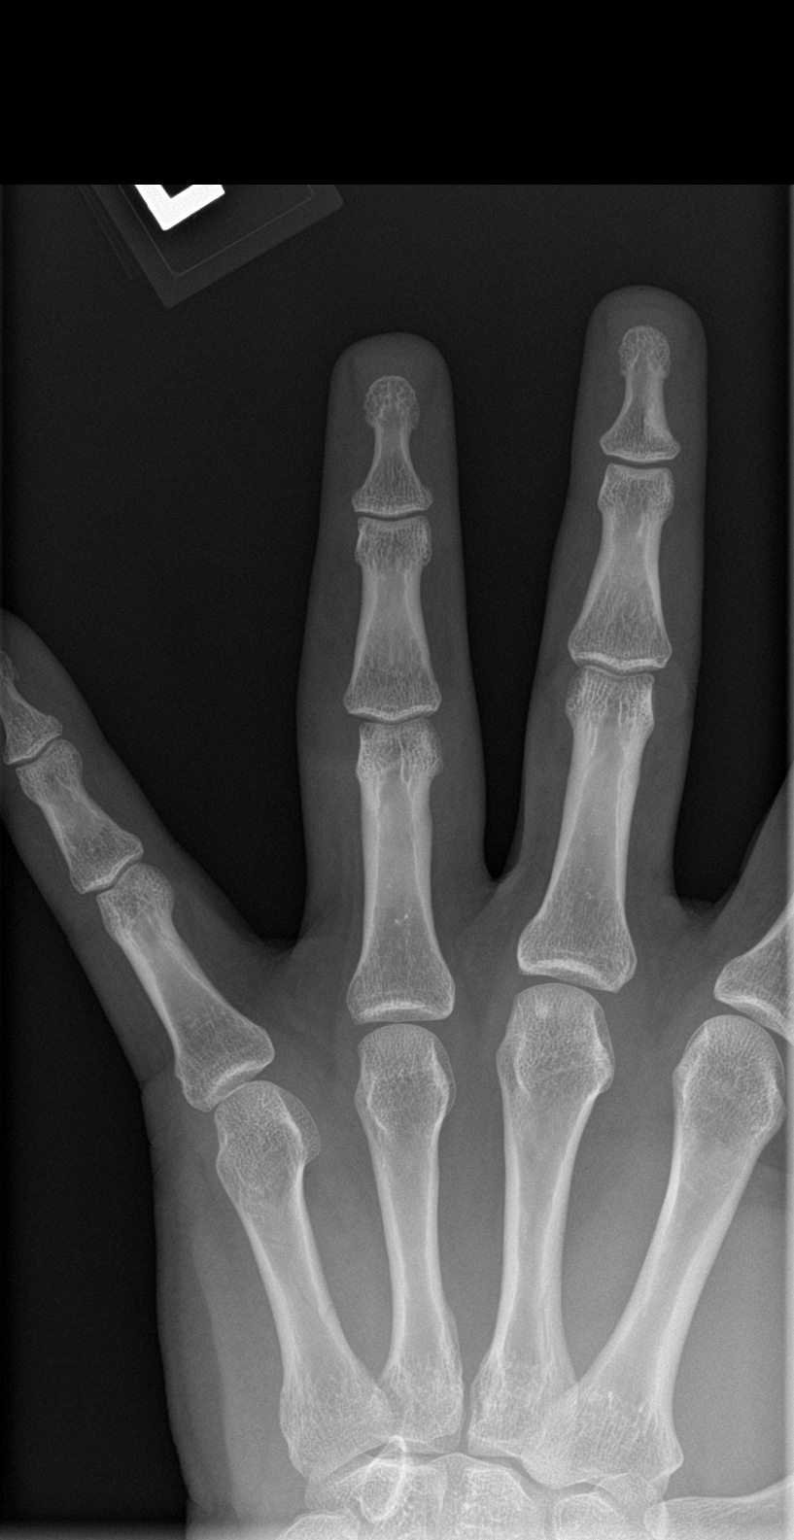

[finger obl]
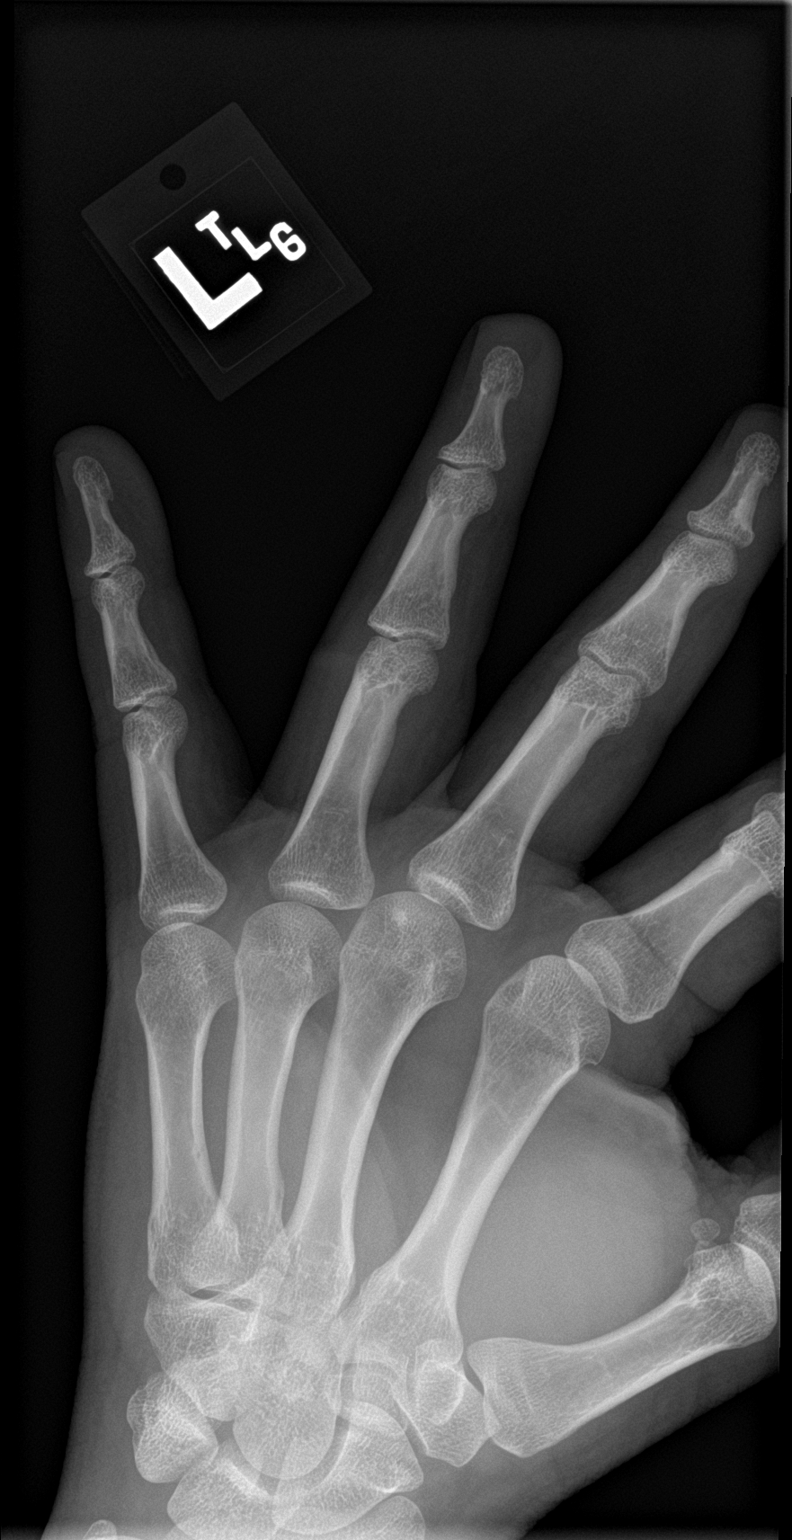

[finger lat]
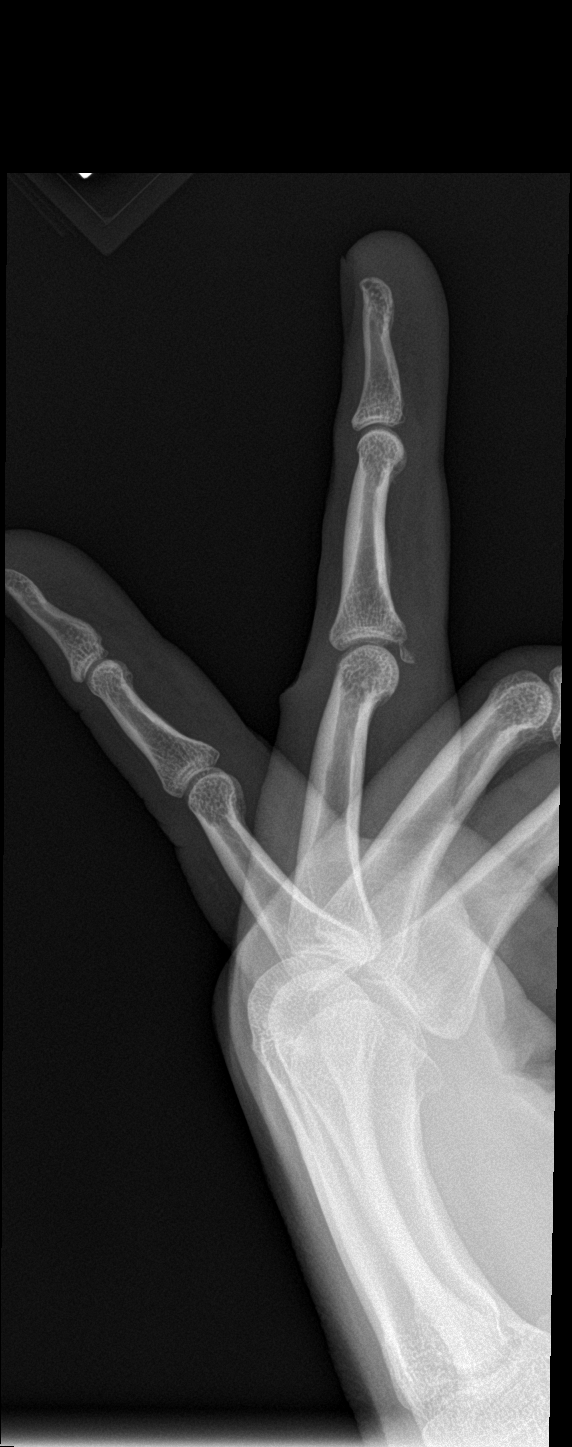

[3 of 3 positions shown; findings below may reference images not displayed]

FINDINGS: Displaced volar plate avulsion fracture of the base of the middle
phalanx of the ring finger is again seen. Position and alignment are
unchanged. Fracture margins at the donor site are healing. No new
abnormality.
IMPRESSION: No change in a displaced volar plate avulsion fracture of the base
of the middle phalanx left ring finger. Fracture margins at the
fracture fragment donor site are healing.

## 2023-07-20 LAB — COLOGUARD: COLOGUARD: NEGATIVE

## 2024-02-07 ENCOUNTER — Encounter: Payer: Self-pay | Admitting: Family Medicine
